# Patient Record
Sex: Female | Born: 1960 | Race: White | Hispanic: No | Marital: Married | Smoking: Current every day smoker
Health system: Southern US, Community
[De-identification: ages and names within clinical notes are randomized; demographics above are authoritative.]

## PROBLEM LIST (undated history)

## (undated) DIAGNOSIS — N183 Chronic kidney disease, stage 3 unspecified: Secondary | ICD-10-CM

## (undated) DIAGNOSIS — I739 Peripheral vascular disease, unspecified: Secondary | ICD-10-CM

## (undated) DIAGNOSIS — E119 Type 2 diabetes mellitus without complications: Secondary | ICD-10-CM

## (undated) DIAGNOSIS — I2510 Atherosclerotic heart disease of native coronary artery without angina pectoris: Secondary | ICD-10-CM

## (undated) DIAGNOSIS — D509 Iron deficiency anemia, unspecified: Secondary | ICD-10-CM

## (undated) DIAGNOSIS — I10 Essential (primary) hypertension: Secondary | ICD-10-CM

## (undated) DIAGNOSIS — I5032 Chronic diastolic (congestive) heart failure: Secondary | ICD-10-CM

## (undated) DIAGNOSIS — M869 Osteomyelitis, unspecified: Secondary | ICD-10-CM

## (undated) DIAGNOSIS — I779 Disorder of arteries and arterioles, unspecified: Secondary | ICD-10-CM

## (undated) DIAGNOSIS — G459 Transient cerebral ischemic attack, unspecified: Secondary | ICD-10-CM

## (undated) DIAGNOSIS — F419 Anxiety disorder, unspecified: Secondary | ICD-10-CM

## (undated) DIAGNOSIS — I82409 Acute embolism and thrombosis of unspecified deep veins of unspecified lower extremity: Secondary | ICD-10-CM

## (undated) HISTORY — PX: ABDOMINAL HYSTERECTOMY: SHX81

## (undated) HISTORY — PX: TRANSCAROTID ARTERY REVASCULARIZATION (TCAR): SHX6784

## (undated) HISTORY — PX: CHOLECYSTECTOMY: SHX55

## (undated) HISTORY — PX: TOE AMPUTATION: SHX809

## (undated) HISTORY — PX: BACK SURGERY: SHX140

---

## 2015-06-27 DIAGNOSIS — H5213 Myopia, bilateral: Secondary | ICD-10-CM | POA: Insufficient documentation

## 2015-06-27 DIAGNOSIS — H52223 Regular astigmatism, bilateral: Secondary | ICD-10-CM | POA: Insufficient documentation

## 2015-06-27 DIAGNOSIS — H029 Unspecified disorder of eyelid: Secondary | ICD-10-CM | POA: Insufficient documentation

## 2015-06-27 DIAGNOSIS — H2513 Age-related nuclear cataract, bilateral: Secondary | ICD-10-CM | POA: Insufficient documentation

## 2015-06-27 DIAGNOSIS — H35033 Hypertensive retinopathy, bilateral: Secondary | ICD-10-CM | POA: Insufficient documentation

## 2015-10-26 DIAGNOSIS — E1122 Type 2 diabetes mellitus with diabetic chronic kidney disease: Secondary | ICD-10-CM

## 2015-10-26 DIAGNOSIS — I10 Essential (primary) hypertension: Secondary | ICD-10-CM | POA: Diagnosis not present

## 2015-10-26 DIAGNOSIS — E1142 Type 2 diabetes mellitus with diabetic polyneuropathy: Secondary | ICD-10-CM

## 2015-10-26 DIAGNOSIS — E119 Type 2 diabetes mellitus without complications: Secondary | ICD-10-CM | POA: Diagnosis not present

## 2015-10-26 DIAGNOSIS — N938 Other specified abnormal uterine and vaginal bleeding: Secondary | ICD-10-CM

## 2015-10-26 DIAGNOSIS — I82409 Acute embolism and thrombosis of unspecified deep veins of unspecified lower extremity: Secondary | ICD-10-CM

## 2015-10-26 DIAGNOSIS — D62 Acute posthemorrhagic anemia: Secondary | ICD-10-CM

## 2015-10-26 DIAGNOSIS — E785 Hyperlipidemia, unspecified: Secondary | ICD-10-CM

## 2015-10-27 DIAGNOSIS — E1142 Type 2 diabetes mellitus with diabetic polyneuropathy: Secondary | ICD-10-CM | POA: Diagnosis not present

## 2015-10-27 DIAGNOSIS — I82409 Acute embolism and thrombosis of unspecified deep veins of unspecified lower extremity: Secondary | ICD-10-CM | POA: Diagnosis not present

## 2015-10-27 DIAGNOSIS — N938 Other specified abnormal uterine and vaginal bleeding: Secondary | ICD-10-CM

## 2015-10-27 DIAGNOSIS — E119 Type 2 diabetes mellitus without complications: Secondary | ICD-10-CM

## 2015-10-27 DIAGNOSIS — E1122 Type 2 diabetes mellitus with diabetic chronic kidney disease: Secondary | ICD-10-CM | POA: Diagnosis not present

## 2015-10-27 DIAGNOSIS — E785 Hyperlipidemia, unspecified: Secondary | ICD-10-CM | POA: Diagnosis not present

## 2015-10-27 DIAGNOSIS — I10 Essential (primary) hypertension: Secondary | ICD-10-CM | POA: Diagnosis not present

## 2015-10-27 DIAGNOSIS — D62 Acute posthemorrhagic anemia: Secondary | ICD-10-CM | POA: Diagnosis not present

## 2015-10-28 DIAGNOSIS — I10 Essential (primary) hypertension: Secondary | ICD-10-CM | POA: Diagnosis not present

## 2015-10-28 DIAGNOSIS — E119 Type 2 diabetes mellitus without complications: Secondary | ICD-10-CM | POA: Diagnosis not present

## 2015-10-28 DIAGNOSIS — N938 Other specified abnormal uterine and vaginal bleeding: Secondary | ICD-10-CM | POA: Diagnosis not present

## 2015-10-28 DIAGNOSIS — D62 Acute posthemorrhagic anemia: Secondary | ICD-10-CM | POA: Diagnosis not present

## 2015-10-29 DIAGNOSIS — E119 Type 2 diabetes mellitus without complications: Secondary | ICD-10-CM | POA: Diagnosis not present

## 2015-10-29 DIAGNOSIS — D62 Acute posthemorrhagic anemia: Secondary | ICD-10-CM | POA: Diagnosis not present

## 2015-10-29 DIAGNOSIS — N938 Other specified abnormal uterine and vaginal bleeding: Secondary | ICD-10-CM | POA: Diagnosis not present

## 2015-10-29 DIAGNOSIS — I10 Essential (primary) hypertension: Secondary | ICD-10-CM | POA: Diagnosis not present

## 2015-10-31 DIAGNOSIS — Z86718 Personal history of other venous thrombosis and embolism: Secondary | ICD-10-CM | POA: Insufficient documentation

## 2015-10-31 DIAGNOSIS — N950 Postmenopausal bleeding: Secondary | ICD-10-CM | POA: Insufficient documentation

## 2015-12-05 ENCOUNTER — Other Ambulatory Visit: Payer: Self-pay

## 2015-12-05 ENCOUNTER — Encounter: Payer: Self-pay | Admitting: Gynecology

## 2015-12-05 ENCOUNTER — Ambulatory Visit: Payer: Medicaid Other | Attending: Gynecology | Admitting: Gynecology

## 2015-12-05 VITALS — BP 155/74 | HR 112 | Temp 97.9°F | Resp 20 | Ht 68.0 in | Wt 252.9 lb

## 2015-12-05 DIAGNOSIS — N840 Polyp of corpus uteri: Secondary | ICD-10-CM | POA: Insufficient documentation

## 2015-12-05 DIAGNOSIS — Z79899 Other long term (current) drug therapy: Secondary | ICD-10-CM | POA: Insufficient documentation

## 2015-12-05 DIAGNOSIS — N939 Abnormal uterine and vaginal bleeding, unspecified: Secondary | ICD-10-CM | POA: Diagnosis not present

## 2015-12-05 DIAGNOSIS — E119 Type 2 diabetes mellitus without complications: Secondary | ICD-10-CM | POA: Insufficient documentation

## 2015-12-05 DIAGNOSIS — L0231 Cutaneous abscess of buttock: Secondary | ICD-10-CM

## 2015-12-05 DIAGNOSIS — L03317 Cellulitis of buttock: Secondary | ICD-10-CM | POA: Diagnosis not present

## 2015-12-05 DIAGNOSIS — F17210 Nicotine dependence, cigarettes, uncomplicated: Secondary | ICD-10-CM | POA: Insufficient documentation

## 2015-12-05 DIAGNOSIS — N8501 Benign endometrial hyperplasia: Secondary | ICD-10-CM

## 2015-12-05 MED ORDER — LEVOFLOXACIN 750 MG PO TABS: 750.0000 mg | ORAL_TABLET | Freq: Every day | ORAL | 0 refills | Status: AC

## 2015-12-05 NOTE — Patient Instructions (Addendum)
You were given a prescription for an antibiotic (Levaquin 750 mg) once daily for 10 days. Follow up with Dr. Senaida Oresichardson for the vulvar cellulitis and plan to have a Mirena IUD placed in his office.  Please call for any questions or concerns.

## 2015-12-05 NOTE — Progress Notes (Signed)
Consult Note: Gyn-Onc   Tresa EndoKelly Day [place] 55 y.o. female  No chief complaint on file.   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 Cellulitis of the left buttocks. Multiple comorbidities.  Plan:  Which regard to the endometrial polyps, I think the patient be best managed by placement of a Mirena IUD rather than risking the morbidity associated with a hysterectomy. Patient and her husband informed that complex hyperplasia without atypia is not a premalignant lesion and that the progesterone in the Mirena will most likely take care of the hyperplasia. I have contacted Dr. Alison Murrayris Richardson we will arrange to have the Mirena IUD placed in his office.  Cellulitis of the buttocks: The patient's instructed to use sitz baths 3 times a day and is given a prescription for Levaquin 750 mg daily for 10 days. Dr. Senaida Oresichardson we'll follow this as it may well require incision and drainage and we certainly want to avoid potential development of necrotizing fasciitis given the patient's diabetes and smoking history.     HPI: 55 year old white female seen in consultation request of Dr. Alison Murrayris Richardson regarding management of abnormal uterine bleeding. The patient underwent a D&C on 11/17/2015 revealing an endometrial polyp and focal complex hyperplasia without atypia. Subsequently the patient's had no further bleeding. Over the last 3 days the patient is also developed a very painful area of her left buttocks. She denies any fever or chills. Patient has a number of comorbid conditions noted in her past medical history.   Review of Systems:10 point review of systems is negative except as noted in interval history.   Vitals: Blood pressure (!) 155/74, pulse (!) 112, temperature 97.9 F (36.6 C), temperature source Oral, resp. rate 20, height 5\' 8"  (1.727 m), weight 252 lb 14.4 oz (114.7 kg), SpO2 100 %.  Physical Exam: General : The patient is a healthy woman in no acute  distress.  HEENT: normocephalic, extraoccular movements normal; neck is supple without thyromegally  Lynphnodes: Supraclavicular and inguinal nodes not enlarged  Abdomen: Soft, non-tender, no ascites, no organomegally, no masses, no hernias  Pelvic:  EGBUS:  on the right labial crural fold is a linear open area that is granulating and is not tender or painful. Patient has diffuse evidence of a yeast vulvitis. In addition on the left posterior buttocks is an area that is exquisitely tender and inflamed consistent with cellulitis. It is not fluctuant. Vagina is normal as is the cervix there is no bleeding noted. Bimanual exam is compromised by the patient's habitus and I cannot outline her uterus.  Bi-manual examination: Non-tender; no adenxal masses or nodularity  Rectal: normal sphincter tone, no masses, no blood  Lower extremities: No edema or varicosities. Normal range of motion      Not on File  History reviewed. No pertinent past medical history. diabetes, venous thrombosis, arthritis, obesity, asthma, diabetic retinopathy, hypertension,   History reviewed. No pertinent surgical history.  Current Outpatient Prescriptions  Medication Sig Dispense Refill  . B Complex Vitamins (VITAMIN B COMPLEX PO) Take by mouth.    . cephALEXin (KEFLEX) 500 MG capsule Take 500 mg by mouth 2 (two) times daily.  0  . cetirizine (ZYRTEC) 10 MG tablet TAKE ONE TABLET BY MOUTH EVERY DAY    . doxycycline (VIBRA-TABS) 100 MG tablet Take 100 mg by mouth 2 (two) times daily.  0  . EPINEPHrine 0.3 mg/0.3 mL IJ SOAJ injection See admin instructions.  0  . EPINEPHrine 0.3 mg/0.3 mL IJ SOAJ injection  AS DIRECTED    . FERROUS SULFATE PO Take 325 mg by mouth.    . gabapentin (NEURONTIN) 800 MG tablet TAKE 1 TABLET FOUR TIMES DAILY.    Marland Kitchen. glimepiride (AMARYL) 4 MG tablet Take 4 mg by mouth 2 (two) times daily.  0  . losartan (COZAAR) 25 MG tablet Take 50 mg by mouth daily.  2  . meloxicam (MOBIC) 15 MG tablet Take  15 mg by mouth.    . metroNIDAZOLE (METROGEL) 0.75 % vaginal gel INSERT ONE APPLICATORFUL VAGINALLY TWICE DAILY  0  . oxyCODONE-acetaminophen (PERCOCET/ROXICET) 5-325 MG tablet Take 1 tablet by mouth 2 (two) times daily.  0  . phentermine (ADIPEX-P) 37.5 MG tablet Take 18.75 mg by mouth daily.  0  . sitaGLIPtin (JANUVIA) 100 MG tablet TAKE 1 Tablet BY MOUTH ONCE DAILY    . SUMAtriptan (IMITREX) 100 MG tablet TAKE 1 TABLET EVERY DAY AS DIRECTED    . tiZANidine (ZANAFLEX) 4 MG tablet Take 4 mg by mouth 2 (two) times daily.  0  . traZODone (DESYREL) 50 MG tablet TAKE ONE TABLET BY MOUTH AT BEDTIME    . triamcinolone (KENALOG) 0.025 % cream APPLY TO THE AFFECTED AREA DAILY  0  . Vitamin D, Ergocalciferol, (DRISDOL) 50000 units CAPS capsule Take 50,000 Units by mouth once a week.  0   No current facility-administered medications for this visit.     Social History   Social History  . Marital status: Married    Spouse name: N/A  . Number of children: N/A  . Years of education: N/A   Occupational History  . Not on file.   Social History Main Topics  . Smoking status: Current Every Day Smoker    Packs/day: 1.50    Years: 15.00    Types: Cigarettes  . Smokeless tobacco: Never Used  . Alcohol use No  . Drug use: No  . Sexual activity: Not on file   Other Topics Concern  . Not on file   Social History Narrative  . No narrative on file    History reviewed. No pertinent family history.    De Blanchaniel Clarke-Pearson, MD 12/05/2015, 12:39 PM

## 2016-08-20 DIAGNOSIS — Z86718 Personal history of other venous thrombosis and embolism: Secondary | ICD-10-CM

## 2016-08-20 DIAGNOSIS — L03315 Cellulitis of perineum: Secondary | ICD-10-CM

## 2016-08-20 DIAGNOSIS — I10 Essential (primary) hypertension: Secondary | ICD-10-CM

## 2016-08-20 DIAGNOSIS — N764 Abscess of vulva: Secondary | ICD-10-CM

## 2016-08-20 DIAGNOSIS — E1122 Type 2 diabetes mellitus with diabetic chronic kidney disease: Secondary | ICD-10-CM

## 2016-08-20 DIAGNOSIS — E872 Acidosis: Secondary | ICD-10-CM

## 2016-08-20 DIAGNOSIS — E1149 Type 2 diabetes mellitus with other diabetic neurological complication: Secondary | ICD-10-CM

## 2016-08-20 DIAGNOSIS — E1142 Type 2 diabetes mellitus with diabetic polyneuropathy: Secondary | ICD-10-CM

## 2016-08-20 DIAGNOSIS — R791 Abnormal coagulation profile: Secondary | ICD-10-CM

## 2016-08-20 DIAGNOSIS — E785 Hyperlipidemia, unspecified: Secondary | ICD-10-CM

## 2016-08-21 ENCOUNTER — Inpatient Hospital Stay (HOSPITAL_COMMUNITY)
Admission: AD | Admit: 2016-08-21 | Discharge: 2016-09-02 | DRG: 746 | Disposition: A | Discharge status: Home / Self Care | Payer: Medicaid Other | Source: Other Acute Inpatient Hospital | Attending: Nephrology | Admitting: Nephrology

## 2016-08-21 ENCOUNTER — Encounter (HOSPITAL_COMMUNITY): Payer: Self-pay | Admitting: Internal Medicine

## 2016-08-21 DIAGNOSIS — Z6838 Body mass index (BMI) 38.0-38.9, adult: Secondary | ICD-10-CM

## 2016-08-21 DIAGNOSIS — Z86718 Personal history of other venous thrombosis and embolism: Secondary | ICD-10-CM

## 2016-08-21 DIAGNOSIS — R791 Abnormal coagulation profile: Secondary | ICD-10-CM

## 2016-08-21 DIAGNOSIS — R300 Dysuria: Secondary | ICD-10-CM | POA: Diagnosis present

## 2016-08-21 DIAGNOSIS — F329 Major depressive disorder, single episode, unspecified: Secondary | ICD-10-CM | POA: Diagnosis present

## 2016-08-21 DIAGNOSIS — I5032 Chronic diastolic (congestive) heart failure: Secondary | ICD-10-CM | POA: Diagnosis present

## 2016-08-21 DIAGNOSIS — Z7901 Long term (current) use of anticoagulants: Secondary | ICD-10-CM

## 2016-08-21 DIAGNOSIS — L02412 Cutaneous abscess of left axilla: Secondary | ICD-10-CM | POA: Diagnosis present

## 2016-08-21 DIAGNOSIS — E876 Hypokalemia: Secondary | ICD-10-CM | POA: Diagnosis not present

## 2016-08-21 DIAGNOSIS — D509 Iron deficiency anemia, unspecified: Secondary | ICD-10-CM | POA: Diagnosis present

## 2016-08-21 DIAGNOSIS — N183 Chronic kidney disease, stage 3 unspecified: Secondary | ICD-10-CM | POA: Diagnosis present

## 2016-08-21 DIAGNOSIS — L03112 Cellulitis of left axilla: Secondary | ICD-10-CM

## 2016-08-21 DIAGNOSIS — F17210 Nicotine dependence, cigarettes, uncomplicated: Secondary | ICD-10-CM | POA: Diagnosis present

## 2016-08-21 DIAGNOSIS — R1013 Epigastric pain: Secondary | ICD-10-CM | POA: Diagnosis not present

## 2016-08-21 DIAGNOSIS — N764 Abscess of vulva: Principal | ICD-10-CM | POA: Diagnosis present

## 2016-08-21 DIAGNOSIS — N8502 Endometrial intraepithelial neoplasia [EIN]: Secondary | ICD-10-CM | POA: Diagnosis present

## 2016-08-21 DIAGNOSIS — Z79899 Other long term (current) drug therapy: Secondary | ICD-10-CM

## 2016-08-21 DIAGNOSIS — I10 Essential (primary) hypertension: Secondary | ICD-10-CM | POA: Diagnosis present

## 2016-08-21 DIAGNOSIS — R072 Precordial pain: Secondary | ICD-10-CM | POA: Diagnosis not present

## 2016-08-21 DIAGNOSIS — E1165 Type 2 diabetes mellitus with hyperglycemia: Secondary | ICD-10-CM | POA: Diagnosis present

## 2016-08-21 DIAGNOSIS — E1122 Type 2 diabetes mellitus with diabetic chronic kidney disease: Secondary | ICD-10-CM | POA: Diagnosis not present

## 2016-08-21 DIAGNOSIS — I130 Hypertensive heart and chronic kidney disease with heart failure and stage 1 through stage 4 chronic kidney disease, or unspecified chronic kidney disease: Secondary | ICD-10-CM | POA: Diagnosis present

## 2016-08-21 DIAGNOSIS — E119 Type 2 diabetes mellitus without complications: Secondary | ICD-10-CM | POA: Diagnosis present

## 2016-08-21 DIAGNOSIS — Z791 Long term (current) use of non-steroidal anti-inflammatories (NSAID): Secondary | ICD-10-CM

## 2016-08-21 DIAGNOSIS — L03315 Cellulitis of perineum: Secondary | ICD-10-CM | POA: Diagnosis present

## 2016-08-21 DIAGNOSIS — I82409 Acute embolism and thrombosis of unspecified deep veins of unspecified lower extremity: Secondary | ICD-10-CM | POA: Diagnosis present

## 2016-08-21 DIAGNOSIS — Z841 Family history of disorders of kidney and ureter: Secondary | ICD-10-CM

## 2016-08-21 DIAGNOSIS — Z7984 Long term (current) use of oral hypoglycemic drugs: Secondary | ICD-10-CM

## 2016-08-21 DIAGNOSIS — F419 Anxiety disorder, unspecified: Secondary | ICD-10-CM | POA: Diagnosis present

## 2016-08-21 DIAGNOSIS — Z833 Family history of diabetes mellitus: Secondary | ICD-10-CM | POA: Diagnosis not present

## 2016-08-21 DIAGNOSIS — I509 Heart failure, unspecified: Secondary | ICD-10-CM

## 2016-08-21 DIAGNOSIS — R102 Pelvic and perineal pain: Secondary | ICD-10-CM | POA: Diagnosis present

## 2016-08-21 DIAGNOSIS — E1169 Type 2 diabetes mellitus with other specified complication: Secondary | ICD-10-CM

## 2016-08-21 HISTORY — DX: Chronic kidney disease, stage 3 unspecified: N18.30

## 2016-08-21 HISTORY — DX: Essential (primary) hypertension: I10

## 2016-08-21 HISTORY — DX: Chronic kidney disease, stage 3 (moderate): N18.3

## 2016-08-21 HISTORY — DX: Abnormal coagulation profile: R79.1

## 2016-08-21 HISTORY — DX: Cutaneous abscess of left axilla: L02.412

## 2016-08-21 HISTORY — DX: Cellulitis of perineum: L03.315

## 2016-08-21 HISTORY — DX: Acute embolism and thrombosis of unspecified deep veins of unspecified lower extremity: I82.409

## 2016-08-21 HISTORY — DX: Abscess of vulva: N76.4

## 2016-08-21 HISTORY — DX: Chronic diastolic (congestive) heart failure: I50.32

## 2016-08-21 HISTORY — DX: Iron deficiency anemia, unspecified: D50.9

## 2016-08-21 HISTORY — DX: Type 2 diabetes mellitus without complications: E11.9

## 2016-08-21 HISTORY — DX: Cellulitis of left axilla: L03.112

## 2016-08-21 MED ORDER — ZOLPIDEM TARTRATE 5 MG PO TABS: 5.0000 mg | ORAL_TABLET | Freq: Every evening | ORAL | Status: DC | PRN

## 2016-08-21 MED ORDER — TRAZODONE HCL 50 MG PO TABS
150.0000 mg | ORAL_TABLET | Freq: Every day | ORAL | Status: DC
  Administered 2016-08-22 – 2016-09-01 (×12): 150 mg via ORAL
  Filled 2016-08-21 (×11): qty 1

## 2016-08-21 MED ORDER — ACETAMINOPHEN 325 MG PO TABS: 650.0000 mg | ORAL_TABLET | Freq: Four times a day (QID) | ORAL | Status: DC | PRN

## 2016-08-21 MED ORDER — OXYCODONE-ACETAMINOPHEN 5-325 MG PO TABS
1.0000 | ORAL_TABLET | ORAL | Status: DC | PRN
  Administered 2016-08-22 – 2016-09-02 (×34): 1 via ORAL
  Filled 2016-08-21 (×35): qty 1

## 2016-08-21 MED ORDER — SUMATRIPTAN SUCCINATE 50 MG PO TABS
50.0000 mg | ORAL_TABLET | ORAL | Status: DC | PRN
  Filled 2016-08-21: qty 1

## 2016-08-21 MED ORDER — PREGABALIN 100 MG PO CAPS
100.0000 mg | ORAL_CAPSULE | Freq: Two times a day (BID) | ORAL | Status: DC
  Administered 2016-08-22 – 2016-09-02 (×24): 100 mg via ORAL
  Filled 2016-08-21 (×23): qty 1

## 2016-08-21 MED ORDER — ONDANSETRON HCL 4 MG/2ML IJ SOLN: 4.0000 mg | Freq: Three times a day (TID) | INTRAMUSCULAR | Status: DC | PRN

## 2016-08-21 MED ORDER — NICOTINE 21 MG/24HR TD PT24
21.0000 mg | MEDICATED_PATCH | Freq: Every day | TRANSDERMAL | Status: DC
  Administered 2016-08-24 – 2016-08-26 (×3): 21 mg via TRANSDERMAL
  Filled 2016-08-21 (×8): qty 1

## 2016-08-21 MED ORDER — ACETAMINOPHEN 650 MG RE SUPP: 650.0000 mg | Freq: Four times a day (QID) | RECTAL | Status: DC | PRN

## 2016-08-21 MED ORDER — FERROUS SULFATE 325 (65 FE) MG PO TABS
325.0000 mg | ORAL_TABLET | Freq: Every day | ORAL | Status: DC
  Administered 2016-08-22 – 2016-09-02 (×12): 325 mg via ORAL
  Filled 2016-08-21 (×12): qty 1

## 2016-08-21 MED ORDER — HYDRALAZINE HCL 20 MG/ML IJ SOLN: 5.0000 mg | INTRAMUSCULAR | Status: DC | PRN

## 2016-08-21 MED ORDER — FLUTICASONE PROPIONATE 50 MCG/ACT NA SUSP: 2.0000 | Freq: Every day | NASAL | Status: DC | PRN

## 2016-08-21 MED ORDER — EPINEPHRINE 0.3 MG/0.3ML IJ SOAJ: 0.3000 mg | INTRAMUSCULAR | Status: DC | PRN

## 2016-08-21 MED ORDER — CLOTRIMAZOLE 1 % EX CREA
TOPICAL_CREAM | Freq: Two times a day (BID) | CUTANEOUS | Status: DC
  Administered 2016-08-23 – 2016-09-02 (×15): via TOPICAL
  Filled 2016-08-21: qty 15

## 2016-08-21 MED ORDER — LOSARTAN POTASSIUM 50 MG PO TABS
25.0000 mg | ORAL_TABLET | Freq: Every day | ORAL | Status: DC
  Administered 2016-08-22 – 2016-09-02 (×12): 25 mg via ORAL
  Filled 2016-08-21 (×12): qty 1

## 2016-08-21 MED ORDER — TIZANIDINE HCL 4 MG PO TABS
4.0000 mg | ORAL_TABLET | Freq: Three times a day (TID) | ORAL | Status: DC | PRN
  Administered 2016-08-31 – 2016-09-01 (×2): 4 mg via ORAL
  Filled 2016-08-21 (×2): qty 1

## 2016-08-21 MED ORDER — SENNOSIDES-DOCUSATE SODIUM 8.6-50 MG PO TABS
1.0000 | ORAL_TABLET | Freq: Every evening | ORAL | Status: DC | PRN
  Administered 2016-08-23: 1 via ORAL

## 2016-08-21 NOTE — H&P (Signed)
History and Physical    Hannah [name]:[id number] DOB: 1960-08-05 DOA: 08/21/2016  Referring MD/NP/PA:   PCP: Anselmo Pickler, MD   Patient coming from:  The patient is coming from home.  At baseline, pt is independent for most of ADL.      Chief Complaint: Abscess and cellulitis in perineal and left axillar areas, and supratherapeutic INR.  HPI: Hannah [name] is a 56 y.o. female with medical history significant of hypertension, diabetes mellitus, depression, anxiety, DVT on Coumadin, dCHF, iron deficiency anemia, tobacco abuse, CKD-2, who presents with abscess and cellulitis in perineal and left axillar areas, and supratherapeutic INR.  Pt states that she has been having left axillary abscess in the past two weeks, which has popped up. 6 days ago, she noted labial abscess and perineal pain. The pain is constant, 6 out of 10 in severity, sharp, nonradiating. It is associated with intermittent fever up to 102 and chills.   Pt was initially admitted to [facility] on 08/20/16. She was found to have labial abscess with secondary perineal cellulitis. Her INR was 33 without bleeding. She was given FFP and her INR is down to 3.5 this AM. She was started with IV Zosyn and vancomycin. Currently patient does not have fever or chills. She denies chest pain, SOB, cough, nausea, vomiting, diarrhea, abdominal pain. She reports dysuria and burning on urination recently. Her urinalysis was negative in [facility]. Of note, After patient received 3rd units of FFP and one dose of Lasix, patient developed diffuse itchy and urticarial rash in her trunk, bilateral thighs. No signs of stridor, hoarseness, wheezing tongue swelling. Patient was treated with diphenhydramine, famotidine, Solu-Medrol. Her symptoms have subsided. Per discharge summary, if patient needs diuretics in the future, may use Bumex instead of Lasix.   At admission:  pt was found to have WBC 19.3, lactic acid 1.8, BNP 419,  creatinine 1.07, temperature normal, no tachycardia, oxygen saturation 93% on room air. Patient is admitted to MedSurg bed as inpatient. OB/GYN, Dr. Adrian Blackwater was consulted.  Review of Systems:   General: had fevers, chills, has fatigue HEENT: no blurry vision, hearing changes or sore throat Respiratory: no dyspnea, coughing, wheezing CV: no chest pain, no palpitations GI: no nausea, vomiting, abdominal pain, diarrhea, constipation GU: has dysuria, burning on urination, increased urinary frequency, no hematuria  Ext: no leg edema Neuro: no unilateral weakness, numbness, or tingling, no vision change or hearing loss Skin: had rash, no skin tear. MSK: No muscle spasm, no deformity, no limitation of range of movement in spin Heme: No easy bruising.  Travel history: No recent long distant travel.  Allergy:  Allergies  Allergen Reactions  . Naproxen Other (See Comments)    Emotional and cramps    Past Medical History:  Diagnosis Date  . Chronic diastolic (congestive) heart failure (HCC)   . CKD (chronic kidney disease), stage III   . Diabetes mellitus without complication (HCC)   . DVT (deep venous thrombosis) (HCC)   . Essential hypertension   . Iron deficiency anemia     Past Surgical History:  Procedure Laterality Date  . BACK SURGERY      Social History:  reports that she has been smoking Cigarettes.  She has a 22.50 pack-year smoking history. She has never used smokeless tobacco. She reports that she does not drink alcohol or use drugs.  Family History:  Family History  Problem Relation Age of Onset  . Hypertension Mother   . Diabetes Mellitus II  Father   . Kidney disease Father      Prior to Admission medications   Medication Sig Start Date End Date Taking? Authorizing Provider  B Complex Vitamins (VITAMIN B COMPLEX PO) Take by mouth.    [provider]  cephALEXin (KEFLEX) 500 MG capsule Take 500 mg by mouth 2 (two) times daily. 09/15/15   [provider]  cetirizine (ZYRTEC) 10 MG tablet TAKE ONE TABLET BY MOUTH EVERY DAY 10/18/15   [provider]  doxycycline (VIBRA-TABS) 100 MG tablet Take 100 mg by mouth 2 (two) times daily. 09/23/15   [provider]  EPINEPHrine 0.3 mg/0.3 mL IJ SOAJ injection See admin instructions. 09/12/15   [provider]  EPINEPHrine 0.3 mg/0.3 mL IJ SOAJ injection AS DIRECTED 09/12/15   [provider]  FERROUS SULFATE PO Take 325 mg by mouth.    [provider]  gabapentin (NEURONTIN) 800 MG tablet TAKE 1 TABLET FOUR TIMES DAILY. 04/27/15   [provider]  glimepiride (AMARYL) 4 MG tablet Take 4 mg by mouth 2 (two) times daily. 10/18/15   [provider]  losartan (COZAAR) 25 MG tablet Take 50 mg by mouth daily. 10/03/15   [provider]  meloxicam (MOBIC) 15 MG tablet Take 15 mg by mouth.    [provider]  metroNIDAZOLE (METROGEL) 0.75 % vaginal gel INSERT ONE APPLICATORFUL VAGINALLY TWICE DAILY 09/23/15   [provider]  oxyCODONE-acetaminophen (PERCOCET/ROXICET) 5-325 MG tablet Take 1 tablet by mouth 2 (two) times daily. 09/15/15   [provider]  phentermine (ADIPEX-P) 37.5 MG tablet Take 18.75 mg by mouth daily. 09/08/15   [provider]  sitaGLIPtin (JANUVIA) 100 MG tablet TAKE 1 Tablet BY MOUTH ONCE DAILY 09/20/15   [provider]  SUMAtriptan (IMITREX) 100 MG tablet TAKE 1 TABLET EVERY DAY AS DIRECTED 09/15/15   [provider]  tiZANidine (ZANAFLEX) 4 MG tablet Take 4 mg by mouth 2 (two) times daily. 10/12/15   [provider]  traZODone (DESYREL) 50 MG tablet TAKE ONE TABLET BY MOUTH AT BEDTIME 10/18/15   [provider]  triamcinolone (KENALOG) 0.025 % cream APPLY TO THE AFFECTED AREA DAILY 11/25/15   [provider]  Vitamin D, Ergocalciferol, (DRISDOL) 50000 units CAPS capsule Take 50,000 Units by mouth once a week. 11/25/15   [provider]    Physical Exam: Vitals:   08/21/16 2345  BP: (!) 127/58  Pulse: 84  Resp: 18  Temp: 98.3 F (36.8 C)  TempSrc: Oral  SpO2: 93%  Weight: 111.4 kg (245 lb 9.6 oz)  Height: 5\' 8"  (1.727 m)   General: Not in acute distress HEENT:       Eyes: PERRL, EOMI, no scleral icterus.       ENT: No discharge from the ears and nose, no pharynx injection, no tonsillar enlargement.        Neck: No JVD, no bruit, no mass felt. Heme: No neck lymph node enlargement. Cardiac: S1/S2, RRR, No murmurs, No gallops or rubs. Respiratory: No rales, wheezing, rhonchi or rubs. GI: Soft, nondistended, nontender, no rebound pain, no organomegaly, BS present. GU: No hematuria Ext: No pitting leg edema bilaterally. 2+DP/PT pulse bilaterally. Musculoskeletal: No joint deformities, No joint redness or warmth, no limitation of ROM in spin. Skin: has popped abscess in the left axillary area with a surrounding erythema. Pt has abscess with surrounding erythema in labial and perineal area. Neuro: Alert, oriented X3, cranial nerves II-XII grossly intact,  moves all extremities normally. Psych: Patient is not psychotic, no suicidal or hemocidal ideation.  Labs on Admission: I have personally reviewed following labs and imaging studies  CBC:  Recent Labs Lab 08/21/16 2358  WBC 19.3*  NEUTROABS 16.9*  HGB 12.8  HCT 39.3  MCV 89.1  PLT 250   Basic Metabolic Panel:  Recent Labs Lab 08/21/16 2358  NA 137  K 3.8  CL 105  CO2 20*  GLUCOSE 244*  BUN 24*  CREATININE 1.07*  CALCIUM 8.8*   GFR: Estimated Creatinine Clearance: 75 mL/min (A) (by C-G formula based on SCr of 1.07 mg/dL (H)). Liver Function Tests: No results for input(s): AST, ALT, ALKPHOS, BILITOT, PROT, ALBUMIN in the last 168 hours. No results for input(s): LIPASE, AMYLASE in the last 168 hours. No results for input(s): AMMONIA in the last 168 hours. Coagulation Profile:  Recent Labs Lab 08/21/16 2358  INR 2.74   Cardiac  Enzymes: No results for input(s): CKTOTAL, CKMB, CKMBINDEX, TROPONINI in the last 168 hours. BNP (last 3 results) No results for input(s): PROBNP in the last 8760 hours. HbA1C: No results for input(s): HGBA1C in the last 72 hours. CBG: No results for input(s): GLUCAP in the last 168 hours. Lipid Profile: No results for input(s): CHOL, HDL, LDLCALC, TRIG, CHOLHDL, LDLDIRECT in the last 72 hours. Thyroid Function Tests: No results for input(s): TSH, T4TOTAL, FREET4, T3FREE, THYROIDAB in the last 72 hours. Anemia Panel: No results for input(s): VITAMINB12, FOLATE, FERRITIN, TIBC, IRON, RETICCTPCT in the last 72 hours. Urine analysis: No results found for: COLORURINE, APPEARANCEUR, LABSPEC, PHURINE, GLUCOSEU, HGBUR, BILIRUBINUR, KETONESUR, PROTEINUR, UROBILINOGEN, NITRITE, LEUKOCYTESUR Sepsis Labs: @LABRCNTIP (procalcitonin:4,lacticidven:4) )No results found for this or any previous visit (from the past 240 hour(s)).   Radiological Exams on Admission: No results found.   EKG: Reviewed independently. Sinus rhythm, QTC 440, low voltage, anteroseptal infarction pattern.   Assessment/Plan Principal Problem:   Labial abscess Active Problems:   Iron deficiency anemia   Essential hypertension   DVT (deep venous thrombosis) (HCC)   Diabetes mellitus with renal complications (HCC)   Cellulitis, perineum   Abscess of left axilla   Cellulitis of axilla, left   Chronic diastolic CHF (congestive heart failure) (HCC)   Supratherapeutic INR   CKD (chronic kidney disease), stage III   Labial abscess and perineal cellulitis, and abscess/cellulitis of left axillary area: Currently patient is nonseptic. Lactic acid is normal. Hemodynamically stable.  OB/GYN, Dr. Candelaria Celeste was consulted, plan to do I &D when INR is less than 2. They are unsure if they will do surgery at [facility] versus [facility], depending on where OR availability is.  - will admit to med-surg bed as inpt - continue  vancomycin and Zosyn per pharmacy - PRN Zofran for nausea, morphine and Percocet for pain - Blood cultures x 2  - wound care consult - f/u ob/gyn for recommendations -check INR and PTT  Iron deficiency anemia:  -Continue iron supplement  Hypertension: -IV hydralazine when necessary -Continue Cozaar,  Tobacco abuse: -Did counseling about importance of quitting smoking -Nicotine patch  Hx of DVT: on coumadin with supratherapeutic.  -check INR -hold coumadin now  Diabetes mellitus with renal complications: Last A1c is nor on record. Patient is taking Amaryl at home -SSI -Check A1c  Chronic diastolic CHF (congestive heart failure) [facility]): Patient does not have leg edema JVD. CHF is compensated. 2-D echo on 08/21/16 showed EF 60-65%. In [facility] after patient received 3rd units of FFP and one dose  of Lasix, patient developed diffuse itchy and urticarial rash in her trunk, bilateral thighs. Not sure if pt is allergic to FFP or lasix. It may better to avoid using lasix in future. -check BNP  CKD (chronic kidney disease), stage III: Stable. Creatinine is 1.07 on admission. To follow up renal function by BMP   DVT ppx: None (pt has supratherapeutic INR) Code Status: Full code Family Communication: None at bed side.    Disposition Plan:  Anticipate discharge back to previous home environment Consults called:  Ob/gyn, Dr. Adrian Blackwater Admission status: medical floor/obs      Date of Service 08/22/2016    Lorretta Harp [facility] Pager [phone]  If 7PM-7AM, please contact night-coverage [email or web address] Password [email or web address] 08/22/2016, 1:36 AM

## 2016-08-22 LAB — CBC
HCT: 38.6 % (ref 36.0–46.0)
HEMOGLOBIN: 12.7 g/dL (ref 12.0–15.0)
MCH: 29.5 pg (ref 26.0–34.0)
MCHC: 32.9 g/dL (ref 30.0–36.0)
MCV: 89.6 fL (ref 78.0–100.0)
Platelets: 256 10*3/uL (ref 150–400)
RBC: 4.31 MIL/uL (ref 3.87–5.11)
RDW: 14.5 % (ref 11.5–15.5)
WBC: 17.9 10*3/uL — AB (ref 4.0–10.5)

## 2016-08-22 LAB — BASIC METABOLIC PANEL
ANION GAP: 12 (ref 5–15)
ANION GAP: 9 (ref 5–15)
BUN: 24 mg/dL — ABNORMAL HIGH (ref 6–20)
BUN: 25 mg/dL — ABNORMAL HIGH (ref 6–20)
CALCIUM: 8.8 mg/dL — AB (ref 8.9–10.3)
CALCIUM: 8.8 mg/dL — AB (ref 8.9–10.3)
CHLORIDE: 107 mmol/L (ref 101–111)
CO2: 20 mmol/L — ABNORMAL LOW (ref 22–32)
CO2: 21 mmol/L — AB (ref 22–32)
CREATININE: 1.07 mg/dL — AB (ref 0.44–1.00)
Chloride: 105 mmol/L (ref 101–111)
Creatinine, Ser: 0.99 mg/dL (ref 0.44–1.00)
GFR, EST NON AFRICAN AMERICAN: 56 mL/min — AB (ref >60)
GLUCOSE: 201 mg/dL — AB (ref 65–99)
GLUCOSE: 244 mg/dL — AB (ref 65–99)
POTASSIUM: 3.8 mmol/L (ref 3.5–5.1)
Potassium: 3.8 mmol/L (ref 3.5–5.1)
Sodium: 137 mmol/L (ref 135–145)
Sodium: 137 mmol/L (ref 135–145)

## 2016-08-22 LAB — CBC WITH DIFFERENTIAL/PLATELET
BASOS ABS: 0 10*3/uL (ref 0.0–0.1)
BASOS PCT: 0 %
EOS PCT: 0 %
Eosinophils Absolute: 0 10*3/uL (ref 0.0–0.7)
HCT: 39.3 % (ref 36.0–46.0)
Hemoglobin: 12.8 g/dL (ref 12.0–15.0)
Lymphocytes Relative: 6 %
Lymphs Abs: 1.2 10*3/uL (ref 0.7–4.0)
MCH: 29 pg (ref 26.0–34.0)
MCHC: 32.6 g/dL (ref 30.0–36.0)
MCV: 89.1 fL (ref 78.0–100.0)
MONO ABS: 1.2 10*3/uL — AB (ref 0.1–1.0)
Monocytes Relative: 6 %
Neutro Abs: 16.9 10*3/uL — ABNORMAL HIGH (ref 1.7–7.7)
Neutrophils Relative %: 88 %
PLATELETS: 250 10*3/uL (ref 150–400)
RBC: 4.41 MIL/uL (ref 3.87–5.11)
RDW: 14.5 % (ref 11.5–15.5)
WBC: 19.3 10*3/uL — ABNORMAL HIGH (ref 4.0–10.5)

## 2016-08-22 LAB — HIV ANTIBODY (ROUTINE TESTING W REFLEX): HIV SCREEN 4TH GENERATION: NONREACTIVE

## 2016-08-22 LAB — HEMOGLOBIN A1C
HEMOGLOBIN A1C: 11.9 % — AB (ref 4.8–5.6)
MEAN PLASMA GLUCOSE: 294.83 mg/dL

## 2016-08-22 LAB — APTT: APTT: 60 s — AB (ref 24–36)

## 2016-08-22 LAB — BRAIN NATRIURETIC PEPTIDE: B Natriuretic Peptide: 419 pg/mL — ABNORMAL HIGH (ref 0.0–100.0)

## 2016-08-22 LAB — GLUCOSE, CAPILLARY
GLUCOSE-CAPILLARY: 218 mg/dL — AB (ref 65–99)
GLUCOSE-CAPILLARY: 263 mg/dL — AB (ref 65–99)
Glucose-Capillary: 183 mg/dL — ABNORMAL HIGH (ref 65–99)
Glucose-Capillary: 185 mg/dL — ABNORMAL HIGH (ref 65–99)
Glucose-Capillary: 148 mg/dL — ABNORMAL HIGH (ref 65–99)
Glucose-Capillary: 264 mg/dL — ABNORMAL HIGH (ref 65–99)
Glucose-Capillary: 214 mg/dL — ABNORMAL HIGH (ref 65–99)

## 2016-08-22 LAB — LACTIC ACID, PLASMA: LACTIC ACID, VENOUS: 1.8 mmol/L (ref 0.5–1.9)

## 2016-08-22 LAB — PROTIME-INR
INR: 2.38
INR: 2.74
PROTHROMBIN TIME: 26.4 s — AB (ref 11.4–15.2)
PROTHROMBIN TIME: 29.5 s — AB (ref 11.4–15.2)

## 2016-08-22 LAB — TROPONIN I: Troponin I: 0.09 ng/mL (ref <0.03)

## 2016-08-22 MED ORDER — INSULIN ASPART 100 UNIT/ML ~~LOC~~ SOLN
0.0000 [IU] | Freq: Three times a day (TID) | SUBCUTANEOUS | Status: DC
  Administered 2016-08-22: 2 [IU] via SUBCUTANEOUS
  Administered 2016-08-22: 5 [IU] via SUBCUTANEOUS
  Administered 2016-08-23: 3 [IU] via SUBCUTANEOUS
  Administered 2016-08-23: 5 [IU] via SUBCUTANEOUS
  Administered 2016-08-23: 2 [IU] via SUBCUTANEOUS
  Administered 2016-08-24: 3 [IU] via SUBCUTANEOUS
  Administered 2016-08-24: 5 [IU] via SUBCUTANEOUS
  Administered 2016-08-24: 3 [IU] via SUBCUTANEOUS
  Administered 2016-08-25 (×2): 5 [IU] via SUBCUTANEOUS
  Administered 2016-08-25 – 2016-08-26 (×2): 3 [IU] via SUBCUTANEOUS
  Administered 2016-08-26: 2 [IU] via SUBCUTANEOUS
  Administered 2016-08-26 – 2016-08-27 (×3): 3 [IU] via SUBCUTANEOUS
  Administered 2016-08-27: 5 [IU] via SUBCUTANEOUS
  Administered 2016-08-28: 2 [IU] via SUBCUTANEOUS
  Administered 2016-08-28 (×2): 3 [IU] via SUBCUTANEOUS
  Administered 2016-08-29 (×2): 2 [IU] via SUBCUTANEOUS
  Administered 2016-08-30: 5 [IU] via SUBCUTANEOUS
  Administered 2016-08-30: 2 [IU] via SUBCUTANEOUS
  Administered 2016-08-31: 6 [IU] via SUBCUTANEOUS
  Administered 2016-08-31: 5 [IU] via SUBCUTANEOUS
  Administered 2016-08-31 – 2016-09-01 (×3): 2 [IU] via SUBCUTANEOUS
  Administered 2016-09-02: 1 [IU] via SUBCUTANEOUS

## 2016-08-22 MED ORDER — INSULIN ASPART 100 UNIT/ML ~~LOC~~ SOLN
0.0000 [IU] | Freq: Every day | SUBCUTANEOUS | Status: DC
  Administered 2016-08-22 – 2016-08-24 (×3): 2 [IU] via SUBCUTANEOUS
  Administered 2016-08-25: 3 [IU] via SUBCUTANEOUS
  Administered 2016-08-26 – 2016-08-31 (×2): 2 [IU] via SUBCUTANEOUS

## 2016-08-22 MED ORDER — LIVING WELL WITH DIABETES BOOK
Freq: Once | Status: AC
  Administered 2016-08-22: 13:00:00
  Filled 2016-08-22: qty 1

## 2016-08-22 MED ORDER — FAMOTIDINE 20 MG PO TABS
20.0000 mg | ORAL_TABLET | Freq: Two times a day (BID) | ORAL | Status: DC
  Administered 2016-08-22 – 2016-09-02 (×22): 20 mg via ORAL
  Filled 2016-08-22 (×22): qty 1

## 2016-08-22 MED ORDER — PREGABALIN 100 MG PO CAPS
ORAL_CAPSULE | ORAL | Status: AC
  Filled 2016-08-22: qty 1

## 2016-08-22 MED ORDER — PIPERACILLIN-TAZOBACTAM 3.375 G IVPB
3.3750 g | Freq: Three times a day (TID) | INTRAVENOUS | Status: DC
  Administered 2016-08-22 – 2016-08-31 (×26): 3.375 g via INTRAVENOUS
  Filled 2016-08-22 (×29): qty 50

## 2016-08-22 MED ORDER — EPINEPHRINE PF 1 MG/ML IJ SOLN
0.3000 mg | INTRAMUSCULAR | Status: DC | PRN
  Filled 2016-08-22: qty 1

## 2016-08-22 MED ORDER — TRAZODONE HCL 50 MG PO TABS
ORAL_TABLET | ORAL | Status: AC
  Filled 2016-08-22: qty 3

## 2016-08-22 MED ORDER — VANCOMYCIN HCL IN DEXTROSE 750-5 MG/150ML-% IV SOLN
750.0000 mg | Freq: Two times a day (BID) | INTRAVENOUS | Status: DC
  Administered 2016-08-22 – 2016-08-24 (×4): 750 mg via INTRAVENOUS
  Filled 2016-08-22 (×6): qty 150

## 2016-08-22 NOTE — Progress Notes (Signed)
Pharmacy Antibiotic and Anticoagulation Note  Hannah [name] is a 56 y.o. female transferred [facility] on 08/21/2016 with axilla and labial abscesses.  Pharmacy has been consulted for Vancomycin and Zosyn dosing.  Pt also has a hx of DVT and was on Coumadin PTA.  Plan to bridge with heparin while Coumadin on hold for possible surgery.  INR today 2.38.  Events PTA:  8/3 started on Bactrim PO by PCP (interaction with Coumadin) 8/6 [facility] with worsening abscess, INR 13 8/6 started Vanc 2gm IV q18h and Zosyn 3.375gm IV q6h (30 min infusion) 8/7 @ 1330 rec'd 10mg  SQ Vit K 8/7 PM Tx [place]   Plan: Follow-up INR in AM.  Start heparin when INR <2. Vancomycin 750mg  IV q12h ([facility]) Zosyn 3.375 gm IV q8h (4 hour infusion).  Height: 5\' 8"  (172.7 cm) Weight: 245 lb 9.6 oz (111.4 kg) IBW/kg (Calculated) : 63.9  Temp (24hrs), Avg:98 F (36.7 C), Min:97.7 F (36.5 C), Max:98.3 F (36.8 C)   Recent Labs Lab 08/21/16 2358 08/22/16 0409  WBC 19.3* 17.9*  CREATININE 1.07* 0.99  LATICACIDVEN 1.8  --     Estimated Creatinine Clearance: 81.1 mL/min (by C-G formula based on SCr of 0.99 mg/dL).    Lab Results  Component Value Date   INR 2.38 08/22/2016   INR 2.74 08/21/2016     Allergies  Allergen Reactions  . Naproxen Other (See Comments)    Emotional and cramps    Antimicrobials this admission: Bactrim outpt PTA 8/3 >>8/6 Vanc 8/6 >> * [facility]gm [facility]h [facility] - last dose 8/7 @ 1130 Zosyn 8/6 >> * 3.375gm [facility]h [facility] - last dose 8/7 @ 2000  Dose adjustments this admission:   Microbiology results: 8/8 blood x 2 >> 8/7 urine >>  Thank you for allowing pharmacy to be a part of this patient's care.  Toys 'R' UsKimberly Shalay Carder, Pharm.D., BCPS Clinical Pharmacist Pager: [phone] Clinical phone for 08/22/2016 from 8:30-4:00 is [phone]. After 4pm, please call Main Rx [phone]) for assistance. 08/22/2016  12:38 PM

## 2016-08-22 NOTE — Progress Notes (Signed)
4:56 pm patient reported chest pressure 6/10, bp 104/50 HR 84, RR 14, 99% SPo2. Provider notified, EKG done. Will continue to monitor

## 2016-08-22 NOTE — Progress Notes (Signed)
[facility]. Pt is A&Ox4. Pt lives at home with husband. Pt has large bruised area around IV on left arm, under left arm pit is red area with open abscess, red swollen abscess area to entire groin area, and under bilateral breast is red and dry. Pt oriented to room. Told to call for assistance before getting up. Pt stated understanding. Will continue to monitor pt. Nelda MarseilleJenny Thacker, RN

## 2016-08-22 NOTE — Progress Notes (Signed)
Inpatient Diabetes Program Recommendations  AACE/ADA: New Consensus Statement on Inpatient Glycemic Control (2015)  Target Ranges:  Prepandial:   less than 140 mg/dL      Peak postprandial:   less than 180 mg/dL (1-2 hours)      Critically ill patients:  140 - 180 mg/dL   Lab Results  Component Value Date   GLUCAP 185 (H) 08/22/2016   HGBA1C 11.9 (H) 08/22/2016    Review of Glycemic Control  Diabetes history: DM2 Outpatient Diabetes medications: Amaryl 4 mg bid + Januvia 100 qd Current orders for Inpatient glycemic control: Novolog sensitive correction scale  Inpatient Diabetes Program Recommendations:   Spoke with pt @ bedside about A1C 11.9 (average CBG over the past 2-3 months) 295 results with them and explained what an A1C is, basic pathophysiology of DM Type 2, basic home care, basic diabetes diet nutrition principles, importance of checking CBGs and maintaining good CBG control to prevent long-term and short-term complications. Reviewed signs and symptoms of hyperglycemia and hypoglycemia and how to treat hypoglycemia at home. Also reviewed blood sugar goals at home.  RNs to provide ongoing basic DM education at bedside with this patient. Reviewed insulin pen with patient per her request. Patient has been on Victoza pen in the past but currently has no insurance. Please consider Novolin 70/30 insulin ac bid for affordability if discharged on insulin.  Thank you, Billy FischerJudy E. Anushree Dorsi, RN, MSN, CDE  Diabetes Coordinator Inpatient Glycemic Control Team Team Pager [phone] (8am-5pm) 08/22/2016 12:15 PM

## 2016-08-22 NOTE — Consult Note (Signed)
Reason for Consult: Labial abscess Referring Physician: Lubertha Sayres Hannah [name] is an 56 y.o. female who was originally admitted to [facility] on 08/20/16 for labial abscess.  Pt states she has problems with chronic itching and abscess on her labial at times. Current Sx started last week. Was seen by OB/GYn last Friday and started on antibiotics. However the abscess area continue to worsen over the weekend. Presented to the hospital for further evaluation and management.  Per pt was started on antibiotics and was to have surgery but INR elevated and GYN did not feel comfort with surgery d/t pt's multiple medical problems. Transferred to [facility] hospitalitis service with GYN consult for further management.  Today pt reports feeling better, less pain.   Her multiple medical problems are well documented in her chart.  Pt reports never having children. Menopause several yrs ago. H/O endometrial hyperplasia with atypia. Unsuccessful IUD placement this past March. Was placed on daily Provera but pt self discontinued d/t to H/O DVT.    Menstrual History: Menarche age: 84 No LMP recorded.    Past Medical History:  Diagnosis Date  . Chronic diastolic (congestive) heart failure ([place])   . CKD (chronic kidney disease), stage III   . Diabetes mellitus without complication ([place])   . DVT (deep venous thrombosis) ([place])   . Essential hypertension   . Iron deficiency anemia     Past Surgical History:  Procedure Laterality Date  . BACK SURGERY      Family History  Problem Relation Age of Onset  . Hypertension Mother   . Diabetes Mellitus II Father   . Kidney disease Father     Social History:  reports that she has been smoking Cigarettes.  She has a 22.50 pack-year smoking history. She has never used smokeless tobacco. She reports that she does not drink alcohol or use drugs.  Allergies:  Allergies  Allergen Reactions  . Naproxen Other (See Comments)    Emotional and cramps     Medications: I have reviewed the patient's current medications.  Review of Systems  Constitutional: Negative.   Cardiovascular: Negative.   Gastrointestinal: Negative.   Genitourinary: Negative.     Blood pressure (!) 128/58, pulse 77, temperature 97.7 F (36.5 C), temperature source Oral, resp. rate 18, height 5' 8"  (1.727 m), weight 245 lb 9.6 oz (111.4 kg), SpO2 94 %. Physical Exam  Constitutional:  Obese white female in NAD  Cardiovascular: Normal rate and regular rhythm.   Respiratory: Effort normal and breath sounds normal.  GI: Soft. Bowel sounds are normal.  Genitourinary:  Genitourinary Comments: Left labial cyst noted, non tender and fluctuant, woody induration involving the left inguinal area and mons However no discrete abscess noted    Results for orders placed or performed during the hospital encounter of 08/21/16 (from the past 48 hour(s))  Lactic acid, plasma     Status: None   Collection Time: 08/21/16 11:58 PM  Result Value Ref Range   Lactic Acid, Venous 1.8 0.5 - 1.9 mmol/L  Protime-INR     Status: Abnormal   Collection Time: 08/21/16 11:58 PM  Result Value Ref Range   Prothrombin Time 29.5 (H) 11.4 - 15.2 seconds   INR 2.74   APTT     Status: Abnormal   Collection Time: 08/21/16 11:58 PM  Result Value Ref Range   aPTT 60 (H) 24 - 36 seconds    Comment:        IF BASELINE aPTT IS ELEVATED, SUGGEST  PATIENT RISK ASSESSMENT BE USED TO DETERMINE APPROPRIATE ANTICOAGULANT THERAPY.   CBC with Differential/Platelet     Status: Abnormal   Collection Time: 08/21/16 11:58 PM  Result Value Ref Range   WBC 19.3 (H) 4.0 - 10.5 K/uL   RBC 4.41 3.87 - 5.11 MIL/uL   Hemoglobin 12.8 12.0 - 15.0 g/dL   HCT 39.3 36.0 - 46.0 %   MCV 89.1 78.0 - 100.0 fL   MCH 29.0 26.0 - 34.0 pg   MCHC 32.6 30.0 - 36.0 g/dL   RDW 14.5 11.5 - 15.5 %   Platelets 250 150 - 400 K/uL   Neutrophils Relative % 88 %   Neutro Abs 16.9 (H) 1.7 - 7.7 K/uL   Lymphocytes Relative 6 %    Lymphs Abs 1.2 0.7 - 4.0 K/uL   Monocytes Relative 6 %   Monocytes Absolute 1.2 (H) 0.1 - 1.0 K/uL   Eosinophils Relative 0 %   Eosinophils Absolute 0.0 0.0 - 0.7 K/uL   Basophils Relative 0 %   Basophils Absolute 0.0 0.0 - 0.1 K/uL  Basic metabolic panel     Status: Abnormal   Collection Time: 08/21/16 11:58 PM  Result Value Ref Range   Sodium 137 135 - 145 mmol/L   Potassium 3.8 3.5 - 5.1 mmol/L   Chloride 105 101 - 111 mmol/L   CO2 20 (L) 22 - 32 mmol/L   Glucose, Bld 244 (H) 65 - 99 mg/dL   BUN 24 (H) 6 - 20 mg/dL   Creatinine, Ser 1.07 (H) 0.44 - 1.00 mg/dL   Calcium 8.8 (L) 8.9 - 10.3 mg/dL   GFR calc non Af Amer 56 (L) >60 mL/min   GFR calc Af Amer >60 >60 mL/min    Comment: (NOTE) The eGFR has been calculated using the CKD EPI equation. This calculation has not been validated in all clinical situations. eGFR's persistently <60 mL/min signify possible Chronic Kidney Disease.    Anion gap 12 5 - 15  Brain natriuretic peptide     Status: Abnormal   Collection Time: 08/21/16 11:58 PM  Result Value Ref Range   B Natriuretic Peptide 419.0 (H) 0.0 - 100.0 pg/mL  Glucose, capillary     Status: Abnormal   Collection Time: 08/22/16  2:33 AM  Result Value Ref Range   Glucose-Capillary 218 (H) 65 - 99 mg/dL  Basic metabolic panel     Status: Abnormal   Collection Time: 08/22/16  4:09 AM  Result Value Ref Range   Sodium 137 135 - 145 mmol/L   Potassium 3.8 3.5 - 5.1 mmol/L   Chloride 107 101 - 111 mmol/L   CO2 21 (L) 22 - 32 mmol/L   Glucose, Bld 201 (H) 65 - 99 mg/dL   BUN 25 (H) 6 - 20 mg/dL   Creatinine, Ser 0.99 0.44 - 1.00 mg/dL   Calcium 8.8 (L) 8.9 - 10.3 mg/dL   GFR calc non Af Amer >60 >60 mL/min   GFR calc Af Amer >60 >60 mL/min    Comment: (NOTE) The eGFR has been calculated using the CKD EPI equation. This calculation has not been validated in all clinical situations. eGFR's persistently <60 mL/min signify possible Chronic Kidney Disease.    Anion gap 9  5 - 15  CBC     Status: Abnormal   Collection Time: 08/22/16  4:09 AM  Result Value Ref Range   WBC 17.9 (H) 4.0 - 10.5 K/uL   RBC 4.31 3.87 - 5.11 MIL/uL  Hemoglobin 12.7 12.0 - 15.0 g/dL   HCT 38.6 36.0 - 46.0 %   MCV 89.6 78.0 - 100.0 fL   MCH 29.5 26.0 - 34.0 pg   MCHC 32.9 30.0 - 36.0 g/dL   RDW 14.5 11.5 - 15.5 %   Platelets 256 150 - 400 K/uL  Hemoglobin A1c     Status: Abnormal   Collection Time: 08/22/16  4:09 AM  Result Value Ref Range   Hgb A1c MFr Bld 11.9 (H) 4.8 - 5.6 %    Comment: (NOTE) Pre diabetes:          5.7%-6.4% Diabetes:              >6.4% Glycemic control for   <7.0% adults with diabetes    Mean Plasma Glucose 294.83 mg/dL  Glucose, capillary     Status: Abnormal   Collection Time: 08/22/16  8:13 AM  Result Value Ref Range   Glucose-Capillary 183 (H) 65 - 99 mg/dL  Protime-INR     Status: Abnormal   Collection Time: 08/22/16  9:22 AM  Result Value Ref Range   Prothrombin Time 26.4 (H) 11.4 - 15.2 seconds   INR 2.38   Glucose, capillary     Status: Abnormal   Collection Time: 08/22/16 10:02 AM  Result Value Ref Range   Glucose-Capillary 185 (H) 65 - 99 mg/dL  Glucose, capillary     Status: Abnormal   Collection Time: 08/22/16 12:11 PM  Result Value Ref Range   Glucose-Capillary 148 (H) 65 - 99 mg/dL    No results found.  Assessment/Plan: Left labial cyst without discrete abscess formation on PE  Recommend restarting Vancomycin and Zosyn.  WBC improving. Do not feel that the pt would benefit from surgery at present. Especially enlight of her chronic medical problems and potential for long term wound care. Recommend repeat CT scan Thursday or Friday, compare to scan performed at [facility] this past Monday. Hopefully she will continue to improve with conservative therapy. Removal of labial cyst can be address as an outpt if current inflammation process resolves. Will also need to address endometrial hyperplasia. But again this can be address as out  pt. Discussed with Dr. Annamaria Boots. On behalf of OB/GYN Faculty for [facility] [place] would like to thank [facility] for allow [place] to participate in Mrs [place] care. We will follow along with you. If you have any questions please contact us any time via this phone number [phone].  Chancy Milroy 08/22/2016

## 2016-08-22 NOTE — Progress Notes (Signed)
PROGRESS NOTE  Hannah [name] UJW:[id number] DOB: 01-13-1961 DOA: 08/21/2016 PCP: Anselmo Pickler, MD  HPI/Recap of past 24 hours:  Some pain associated wound, scat drainage, no fever Husband at bedside  Assessment/Plan: Principal Problem:   Labial abscess Active Problems:   Iron deficiency anemia   Essential hypertension   DVT (deep venous thrombosis) (HCC)   Diabetes mellitus with renal complications (HCC)   Cellulitis, perineum   Abscess of left axilla   Cellulitis of axilla, left   Chronic diastolic CHF (congestive heart failure) (HCC)   Supratherapeutic INR   CKD (chronic kidney disease), stage III  Sudden onset of substernal chest pain/epigastric pain after being repositioned in bed, vital stable, no hypoxia, on exam with mild epigastric tenderness, no guarding, no rebound, STAT EKG no acute interval changes compare to the on obtained on admission ( personally reviewed) will try pepcid, cycle troponin,  echocardiogram from outside hospital unremarkable   Labial abscess and perineal cellulitis, and abscess/cellulitis of left axillary area:  Currently patient is nonseptic. Lactic acid is normal. Hemodynamically stable.  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  Patient is taking Amaryl at home -SSI -diabetes education, will need to discuss with patient about starting insulin  Hx of DVT, recurent: on coumadin with supratherapeutic.  -check INR -hold coumadin now, heparin drip once inr donw  Chronic diastolic CHF (congestive heart failure) [facility]): Patient does not have leg edema JVD. CHF is compensated. 2-D echo on 08/21/16 showed EF 60-65%. In  [facility], after patient received 3rd units of FFP and one dose of Lasix, patient developed diffuse itchy and urticarial rash in her trunk, bilateral thighs. Not sure if pt is allergic to FFP or lasix. It may better to avoid using lasix in future. -check BNP  Hypertension: -IV hydralazine when necessary -Continue Cozaar,    CKD (chronic kidney disease), stage III: Stable. Creatinine is 1.07 on admission. To follow up renal function by BMP  Iron deficiency anemia:  -Continue iron supplement  Tobacco abuse: -Did counseling about importance of quitting smoking -Nicotine patch   Body mass index is 37.34 kg/m.  DVT ppx: None (pt has supratherapeutic INR), start heparin drip once inr trending down Code Status: Full code Family Communication: husband at bedside  Disposition Plan:  not ready for discharge  Consultants:  Gyn Dr Austin Miles   Procedures:  none  Antibiotics:  vanc/zosyn   Objective: BP (!) 128/58 (BP Location: Right Arm)   Pulse 77   Temp 97.7 F (36.5 C) (Oral)   Resp 18   Ht 5\' 8"  (1.727 m)   Wt 111.4 kg (245 lb 9.6 oz)   SpO2 94%   BMI 37.34 kg/m   Intake/Output Summary (Last 24 hours) at 08/22/16 1154 Last data filed at 08/22/16 0930  Gross per 24 hour  Intake                0 ml  Output              300 ml  Net             -300 ml   [employer]  08/21/16 2345 08/22/16 0500  Weight: 111.4 kg (245 lb 9.6 oz) 111.4 kg (245 lb 9.6 oz)    Exam: Patient is examined daily including today on 08/22/2016, exam remain the same as of yesterday except that is highlighted below   General:  Obese, NAD  Cardiovascular: RRR  Respiratory: CTABL  Abdomen: Soft/ND/NT, positive BS, induration, tender, warm to touch mons, left inguinal area, left labial   Musculoskeletal: No Edema  Neuro: aaox3  Data Reviewed: Basic Metabolic Panel:  Recent Labs Lab 08/21/16 2358 08/22/16 0409  NA 137 137  K 3.8 3.8  CL 105 107  CO2 20* 21*    GLUCOSE 244* 201*  BUN 24* 25*  CREATININE 1.07* 0.99  CALCIUM 8.8* 8.8*   Liver Function Tests: No results for input(s): AST, ALT, ALKPHOS, BILITOT, PROT, ALBUMIN in the last 168 hours. No results for input(s): LIPASE, AMYLASE in the last 168 hours. No results for input(s): AMMONIA in the last 168 hours. CBC:  Recent Labs Lab 08/21/16 2358 08/22/16 0409  WBC 19.3* 17.9*  NEUTROABS 16.9*  --   HGB 12.8 12.7  HCT 39.3 38.6  MCV 89.1 89.6  PLT 250 256   Cardiac Enzymes:   No results for input(s): CKTOTAL, CKMB, CKMBINDEX, TROPONINI in the last 168 hours. BNP (last 3 results)  Recent Labs  08/21/16 2358  BNP 419.0*    ProBNP (last 3 results) No results for input(s): PROBNP in the last 8760 hours.  CBG:  Recent Labs Lab 08/22/16 0233 08/22/16 0813 08/22/16 1002  GLUCAP 218* 183* 185*    No results found for this or any previous visit (from the past 240 hour(s)).   Studies: No results found.  Scheduled Meds: . clotrimazole   Topical BID  . ferrous sulfate  325 mg Oral Q breakfast  . insulin aspart  0-5 Units Subcutaneous QHS  . insulin aspart  0-9 Units Subcutaneous TID WC  . losartan  25 mg Oral Daily  . nicotine  21 mg Transdermal Daily  . pregabalin  100 mg Oral BID  . traZODone  150 mg Oral QHS    Continuous Infusions:   Time spent: 35 mins I have personally reviewed and interpreted daily labs, tele strips, imagings as discussed above under date review session and assessment and plans.  I have personally reviewed CT pel imaging through pacs system ( not available through epic due to done at a different hospital) that case discussed  And Ct imaging reveiwedwith gyn Dr Alysia PennaERvin in person  Lorilynn Lehr MD, PhD  [facility] Pager [phone]. If 7PM-7AM, please contact night-coverage at [email or web address], password [email or web address] 08/22/2016, 11:54 AM  LOS: 1 day

## 2016-08-22 NOTE — Consult Note (Addendum)
WOC Nurse wound consult note GYN team plans to perform surgery to labia abscess, according to the EMR.  No role for WOC team at this time, please refer to their team for further questions. Reason for Consult: Consult requested for left axilla wound.  Pt states it spontaneously ruptured and drained. Wound type: Full thickness wound over previous abscess site Measurement: .3X.8X.5cm with tunneling at 9:00 o'clock to .8cm Wound bed: narrow opening, unable to visualize but appears red and moist Drainage (amount, consistency, odor) mod amt tan drainage, no odor  Periwound: Erythremia surrounding to 2 cm Dressing procedure/placement/frequency: Iodoform packing to absorb drainage and promote healing.  Foam dressing to protect from further injury. Discussed plan of care with patient and she verbalized understanding. Please re-consult if further assistance is needed.  Thank-you,  Cammie Mcgeeawn Crysta Gulick MSN, RN, CWOCN, LyleWCN-AP, CNS [phone]

## 2016-08-23 LAB — CBC WITH DIFFERENTIAL/PLATELET
Basophils Absolute: 0.2 10*3/uL — ABNORMAL HIGH (ref 0.0–0.1)
Basophils Relative: 1 %
EOS ABS: 0.6 10*3/uL (ref 0.0–0.7)
EOS PCT: 4 %
HCT: 40.5 % (ref 36.0–46.0)
Hemoglobin: 13.3 g/dL (ref 12.0–15.0)
LYMPHS ABS: 2.4 10*3/uL (ref 0.7–4.0)
Lymphocytes Relative: 18 %
MCH: 29.4 pg (ref 26.0–34.0)
MCHC: 32.8 g/dL (ref 30.0–36.0)
MCV: 89.4 fL (ref 78.0–100.0)
MONO ABS: 0.8 10*3/uL (ref 0.1–1.0)
MONOS PCT: 6 %
Neutro Abs: 9.5 10*3/uL — ABNORMAL HIGH (ref 1.7–7.7)
Neutrophils Relative %: 71 %
PLATELETS: 283 10*3/uL (ref 150–400)
RBC: 4.53 MIL/uL (ref 3.87–5.11)
RDW: 14.4 % (ref 11.5–15.5)
WBC: 13.4 10*3/uL — ABNORMAL HIGH (ref 4.0–10.5)

## 2016-08-23 LAB — TROPONIN I
Troponin I: 0.06 ng/mL (ref <0.03)
Troponin I: <0.03 ng/mL (ref <0.03)

## 2016-08-23 LAB — URINE CULTURE: Culture: NO GROWTH

## 2016-08-23 LAB — GLUCOSE, CAPILLARY
GLUCOSE-CAPILLARY: 253 mg/dL — AB (ref 65–99)
GLUCOSE-CAPILLARY: 169 mg/dL — AB (ref 65–99)
GLUCOSE-CAPILLARY: 206 mg/dL — AB (ref 65–99)
GLUCOSE-CAPILLARY: 198 mg/dL — AB (ref 65–99)

## 2016-08-23 LAB — PROTIME-INR
INR: 1.97
PROTHROMBIN TIME: 22.7 s — AB (ref 11.4–15.2)

## 2016-08-23 LAB — HEPARIN LEVEL (UNFRACTIONATED): Heparin Unfractionated: <0.1 IU/mL — ABNORMAL LOW (ref 0.30–0.70)

## 2016-08-23 MED ORDER — POLYETHYLENE GLYCOL 3350 17 G PO PACK
17.0000 g | PACK | Freq: Every day | ORAL | Status: DC | PRN
  Administered 2016-08-23: 17 g via ORAL
  Filled 2016-08-23: qty 1

## 2016-08-23 MED ORDER — HEPARIN (PORCINE) IN NACL 100-0.45 UNIT/ML-% IJ SOLN
2400.0000 [IU]/h | INTRAMUSCULAR | Status: DC
  Administered 2016-08-23: 1300 [IU]/h via INTRAVENOUS
  Administered 2016-08-23: 1600 [IU]/h via INTRAVENOUS
  Administered 2016-08-24: 2000 [IU]/h via INTRAVENOUS
  Administered 2016-08-25: 2400 [IU]/h via INTRAVENOUS
  Administered 2016-08-25: 2000 [IU]/h via INTRAVENOUS
  Administered 2016-08-26 – 2016-08-29 (×7): 2400 [IU]/h via INTRAVENOUS
  Filled 2016-08-23 (×12): qty 250

## 2016-08-23 MED ORDER — HYDROMORPHONE HCL 1 MG/ML IJ SOLN
1.0000 mg | INTRAMUSCULAR | Status: DC | PRN
  Administered 2016-08-23: 2 mg via INTRAVENOUS
  Administered 2016-08-23: 1 mg via INTRAVENOUS
  Administered 2016-08-23: 2 mg via INTRAVENOUS
  Administered 2016-08-24: 1 mg via INTRAVENOUS
  Administered 2016-08-24 (×2): 2 mg via INTRAVENOUS
  Administered 2016-08-25 – 2016-08-26 (×5): 1 mg via INTRAVENOUS
  Filled 2016-08-23: qty 1
  Filled 2016-08-23 (×3): qty 2
  Filled 2016-08-23 (×4): qty 1
  Filled 2016-08-23: qty 2
  Filled 2016-08-23: qty 1
  Filled 2016-08-23: qty 2

## 2016-08-23 MED ORDER — SENNOSIDES-DOCUSATE SODIUM 8.6-50 MG PO TABS
1.0000 | ORAL_TABLET | Freq: Every day | ORAL | Status: DC
  Administered 2016-08-24: 1 via ORAL
  Filled 2016-08-23 (×2): qty 1

## 2016-08-23 NOTE — Progress Notes (Signed)
PROGRESS NOTE  Hannah [name] BJY:[id number] DOB: October 23, 1960 DOA: 08/21/2016 PCP: Anselmo Pickler, MD  HPI/Recap of past 24 hours:  Report pain become more sever, no drainage, no fever   Assessment/Plan: Principal Problem:   Labial abscess Active Problems:   Iron deficiency anemia   Essential hypertension   DVT (deep venous thrombosis) (HCC)   Diabetes mellitus with renal complications (HCC)   Cellulitis, perineum   Abscess of left axilla   Cellulitis of axilla, left   Chronic diastolic CHF (congestive heart failure) (HCC)   Supratherapeutic INR   CKD (chronic kidney disease), stage III  Sudden onset of substernal chest pain/epigastric pain after being repositioned in bed, vital stable, no hypoxia, on exam with mild epigastric tenderness, no guarding, no rebound, STAT EKG no acute interval changes compare to the on obtained on admission ( personally reviewed) will try pepcid, cycle troponin,  echocardiogram from outside hospital unremarkable   Labial abscess and perineal cellulitis, and abscess/cellulitis of left axillary area:  Currently patient is nonseptic. Lactic acid is normal. Hemodynamically stable.  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  Patient is taking Amaryl at home -SSI -diabetes education, I have discussed with  patient about diabetes disease management and  Plan to start her on  Insulin, she report currently does not have insurance, case manager consulted.   Hx of DVT, recurent: on coumadin with supratherapeutic.  -check INR -hold coumadin now, heparin drip started on 8/9  once inr down  Chronic diastolic CHF (congestive heart failure) [facility]): Patient does not have leg edema JVD. CHF is compensated. 2-D echo on 08/21/16 showed EF 60-65%. In [facility], after patient received 3rd units of FFP and one dose of Lasix, patient developed diffuse itchy and urticarial rash in her trunk, bilateral thighs. Not sure if pt is allergic to FFP or lasix. It may better to avoid using lasix in future. -BNP 419  Hypertension: -IV hydralazine when necessary -Continue Cozaar,    CKD (chronic kidney disease), stage III: Stable. Creatinine is 1.07 on admission. To follow up renal function by BMP  Iron deficiency anemia:  -Continue iron supplement  Tobacco abuse: -Did counseling about importance of quitting smoking -Nicotine patch   Body mass index is 37.5 kg/m.  DVT ppx: admitted with supratherapeutic INR,  heparin drip started on 8/9 once inr trending down Code Status: Full code Family Communication: patient  Disposition Plan:  not ready for discharge  Consultants:  Gyn Dr Austin Miles   Procedures:  none  Antibiotics:  vanc/zosyn   Objective: BP (!) 132/56 (BP Location: Right Arm)   Pulse 84   Temp 98.4 F (36.9 C) (Oral)   Resp 18   Ht 5\' 8"  (1.727 m)   Wt 111.9 kg (246 lb 9.6 oz)   SpO2 98%   BMI 37.50 kg/m   Intake/Output Summary (Last 24 hours) at 08/23/16 1804 Last data filed at 08/23/16 1540  Gross per 24 hour  Intake              694 ml  Output  1400 ml  Net             -706 ml   Filed Weights   08/21/16 2345 08/22/16 0500 08/23/16 0410  Weight: 111.4 kg (245 lb 9.6 oz) 111.4 kg (245 lb 9.6 oz) 111.9 kg (246 lb 9.6 oz)    Exam: Patient is examined daily including today on 08/23/2016, exam remain the same as of yesterday except that is highlighted below   General:  Obese, NAD  Cardiovascular: RRR  Respiratory: CTABL  Abdomen: Soft/ND/NT, positive BS, induration, tender, warm to touch mons, left inguinal  area, left labial   Musculoskeletal: No Edema  Neuro: aaox3  Data Reviewed: Basic Metabolic Panel:  Recent Labs Lab 08/21/16 2358 08/22/16 0409  NA 137 137  K 3.8 3.8  CL 105 107  CO2 20* 21*  GLUCOSE 244* 201*  BUN 24* 25*  CREATININE 1.07* 0.99  CALCIUM 8.8* 8.8*   Liver Function Tests: No results for input(s): AST, ALT, ALKPHOS, BILITOT, PROT, ALBUMIN in the last 168 hours. No results for input(s): LIPASE, AMYLASE in the last 168 hours. No results for input(s): AMMONIA in the last 168 hours. CBC:  Recent Labs Lab 08/21/16 2358 08/22/16 0409 08/23/16 1027  WBC 19.3* 17.9* 13.4*  NEUTROABS 16.9*  --  9.5*  HGB 12.8 12.7 13.3  HCT 39.3 38.6 40.5  MCV 89.1 89.6 89.4  PLT 250 256 283   Cardiac Enzymes:    Recent Labs Lab 08/22/16 1814 08/22/16 2243 08/23/16 0515  TROPONINI 0.09* 0.06* <0.03   BNP (last 3 results)  Recent Labs  08/21/16 2358  BNP 419.0*    ProBNP (last 3 results) No results for input(s): PROBNP in the last 8760 hours.  CBG:  Recent Labs Lab 08/22/16 1815 08/22/16 2041 08/23/16 0753 08/23/16 1245 08/23/16 1728  GLUCAP 264* 214* 253* 169* 206*    Recent Results (from the past 240 hour(s))  Urine Culture     Status: None   Collection Time: 08/21/16 11:15 PM  Result Value Ref Range Status   Specimen Description URINE, RANDOM  Final   Special Requests NONE  Final   Culture NO GROWTH  Final   Report Status 08/23/2016 FINAL  Final  Culture, blood (Routine X 2) w Reflex to ID Panel     Status: None (Preliminary result)   Collection Time: 08/21/16 11:58 PM  Result Value Ref Range Status   Specimen Description BLOOD RIGHT HAND  Final   Special Requests   Final    BOTTLES DRAWN AEROBIC AND ANAEROBIC Blood Culture adequate volume   Culture NO GROWTH 1 DAY  Final   Report Status PENDING  Incomplete  Culture, blood (Routine X 2) w Reflex to ID Panel     Status: None (Preliminary result)   Collection Time: 08/22/16 12:08 AM    Result Value Ref Range Status   Specimen Description BLOOD LEFT HAND  Final   Special Requests   Final    BOTTLES DRAWN AEROBIC AND ANAEROBIC Blood Culture adequate volume   Culture NO GROWTH 1 DAY  Final   Report Status PENDING  Incomplete     Studies: No results found.  Scheduled Meds: . clotrimazole   Topical BID  . famotidine  20 mg Oral BID  . ferrous sulfate  325 mg Oral Q breakfast  . insulin aspart  0-5 Units Subcutaneous QHS  . insulin aspart  0-9 Units Subcutaneous TID WC  . losartan  25 mg Oral Daily  . nicotine  21  mg Transdermal Daily  . pregabalin  100 mg Oral BID  . senna-docusate  1 tablet Oral QHS  . traZODone  150 mg Oral QHS    Continuous Infusions: . heparin 1,300 Units/hr (08/23/16 0820)  . piperacillin-tazobactam (ZOSYN)  IV Stopped (08/23/16 1656)  . vancomycin 750 mg (08/23/16 1726)     Time spent: 35 mins I have personally reviewed and interpreted daily labs, tele strips, as discussed above under date review session and assessment and plans.   Daire Okimoto MD, PhD  [facility] Pager [phone]. If 7PM-7AM, please contact night-coverage at [email or web address], password [facility] 08/23/2016, 6:04 PM  LOS: 2 days

## 2016-08-23 NOTE — Progress Notes (Signed)
ANTICOAGULATION CONSULT NOTE   Pharmacy Consult for Heparin Indication: h/o DVT  Allergies  Allergen Reactions  . Naproxen Other (See Comments)    Emotional and cramps    Patient Measurements: Height: 5\' 8"  (172.7 cm) Weight: 246 lb 9.6 oz (111.9 kg) IBW/kg (Calculated) : 63.9 Heparin Dosing Weight: 90 kg  Vital Signs: Temp: 98.4 F (36.9 C) (08/09 1540) Temp Source: Oral (08/09 1540) BP: 132/56 (08/09 1540) Pulse Rate: 84 (08/09 1540)  Labs:  Recent Labs  08/21/16 2358 08/22/16 0409 08/22/16 0922 08/22/16 1814 08/22/16 2243 08/23/16 0515 08/23/16 1027 08/23/16 1639  HGB 12.8 12.7  --   --   --   --  13.3  --   HCT 39.3 38.6  --   --   --   --  40.5  --   PLT 250 256  --   --   --   --  283  --   APTT 60*  --   --   --   --   --   --   --   LABPROT 29.5*  --  26.4*  --   --  22.7*  --   --   INR 2.74  --  2.38  --   --  1.97  --   --   HEPARINUNFRC  --   --   --   --   --   --   --  <0.10*  CREATININE 1.07* 0.99  --   --   --   --   --   --   TROPONINI  --   --   --  0.09* 0.06* <0.03  --   --     Estimated Creatinine Clearance: 81.3 mL/min (by C-G formula based on SCr of 0.99 mg/dL).  Assessment: 56 y.o. female with h/o DVT, Coumadin on hold on IV heparin and initial heparin level is undetectable -INR= 1.97  Goal of Therapy:  Heparin level 0.3-0.7 units/ml Monitor platelets by anticoagulation protocol: Yes   Plan: -no heparin bolus due to elevated INR -Increase heparin to 1600 units/hr -Heparin level in 6 hours and daily wth CBC daily  Harland Germanndrew Maicie Vanderloop, Pharm D 08/23/2016 6:22 PM

## 2016-08-23 NOTE — Progress Notes (Signed)
ANTICOAGULATION CONSULT NOTE - Initial Consult  Pharmacy Consult for Heparin Indication: h/o DVT  Allergies  Allergen Reactions  . Naproxen Other (See Comments)    Emotional and cramps    Patient Measurements: Height: 5\' 8"  (172.7 cm) Weight: 246 lb 9.6 oz (111.9 kg) IBW/kg (Calculated) : 63.9 Heparin Dosing Weight: 90 kg  Vital Signs: Temp: 98.4 F (36.9 C) (08/09 0505) Temp Source: Oral (08/09 0505) BP: 135/64 (08/09 0505) Pulse Rate: 84 (08/09 0505)  Labs:  Recent Labs  08/21/16 2358 08/22/16 0409 08/22/16 0922 08/22/16 1814 08/22/16 2243 08/23/16 0515  HGB 12.8 12.7  --   --   --   --   HCT 39.3 38.6  --   --   --   --   PLT 250 256  --   --   --   --   APTT 60*  --   --   --   --   --   LABPROT 29.5*  --  26.4*  --   --  22.7*  INR 2.74  --  2.38  --   --  1.97  CREATININE 1.07* 0.99  --   --   --   --   TROPONINI  --   --   --  0.09* 0.06* <0.03    Estimated Creatinine Clearance: 81.3 mL/min (by C-G formula based on SCr of 0.99 mg/dL).  Assessment: 56 y.o. female with h/o DVT, Coumadin on hold, for heparin.  INR now subtherapeutic Goal of Therapy:  Heparin level 0.3-0.7 units/ml Monitor platelets by anticoagulation protocol: Yes   Plan:  Start heparin 1300 units/hr Check heparin level in 8 hours.    Jastin Fore, Gary FleetGregory Vernon 08/23/2016,7:55 AM

## 2016-08-23 NOTE — Care Management Note (Signed)
Case Management Note  Patient Details  Name: Hannah [name] MRN: [id number] Date of Birth: 1960/12/20  Subjective/Objective:     Admitted secondary to abscess and cellulitis in perineal and L axillar areas. PMH including but not limited to HTN, depression, DM, CHF and CKD.                Action/Plan:   CM received consult : No insurance, need to discharge home on insulin   CM received consult: Expected Discharge Date:                  Expected Discharge Plan:  Home/Self Care  In-House Referral:     Discharge planning Services  CM Consult, [facility]  Post Acute Care Choice:    Choice offered to:  Patient  DME Arranged:    DME Agency:     HH Arranged:    HH Agency:     Status of Service:  Completed, signed off  If discussed at [employer] Meetings, dates discussed:    Additional Comments:  Epifanio LeschesCole, Cottrell Gentles Hudson, RN 08/23/2016, 3:43 PM

## 2016-08-23 NOTE — Progress Notes (Signed)
GYN  Pt continues to have some left groin pain.  PE AF  GU woody induration of labia majora and extending into the mons, no discrete abscess formation, labia cyst unchanged, no evidence of abscess formation  A/P Celluitis  I think this is more of a chronic cellulitis situation secondary to heradenitis. Will recheck CBC with diff in AM. Adjust pain medication. Continue with Sx treatment with warm compresses and heating pad. Discussed with radiology. CT [facility] did not demonstrate a frank abscess formation. There was a question of such but appears to be related to labial cyst. Inflammatory changes noted. Will repeat CT scan tomorrow for comparison. If drainable abscess noted, would consider IR instead of OR due to open wound and long term healing process. If only inflammation, would continue with IV antibiotics. Discussed POC with pt.

## 2016-08-23 NOTE — Progress Notes (Signed)
INR 1.97 today, Dr. Roda ShuttersXu notified

## 2016-08-23 NOTE — Evaluation (Signed)
Physical Therapy Evaluation Patient Details Name: Hannah [name] MRN: [id number] DOB: 06/19/1960 Today's Date: 08/23/2016   History of Present Illness  Pt is a 56 y/o female admitted secondary to abscess and cellulitis in perineal and L axillar areas. PMH including but not limited to HTN, depression, DM, CHF and CKD.  Clinical Impression  Pt presented supine in bed with HOB elevated, awake and willing to participate in therapy session. Prior to admission, pt reported that she was independent with all functional mobility and ADLs. Pt limited this session secondary to groin pain with movement. Pt ambulated within her room with use of RW and min guard for safety. PT will continue to follow acutely for mobility progression and to ensure a safe d/c home.    Follow Up Recommendations No PT follow up    Equipment Recommendations  Rolling walker with 5" wheels    Recommendations for Other Services       Precautions / Restrictions Restrictions Weight Bearing Restrictions: No      Mobility  Bed Mobility Overal bed mobility: Needs Assistance Bed Mobility: Supine to Sit;Sit to Supine     Supine to sit: Mod assist;HOB elevated Sit to supine: Min guard   General bed mobility comments: increased time and effort, use of bed rails, physical assist to elevate trunk  Transfers Overall transfer level: Needs assistance Equipment used: None Transfers: Sit to/from Stand Sit to Stand: Supervision         General transfer comment: increased time, supervision for safety  Ambulation/Gait Ambulation/Gait assistance: Min guard Ambulation Distance (Feet): 40 Feet Assistive device: Rolling walker (2 wheeled) Gait Pattern/deviations: Step-through pattern;Decreased step length - right;Decreased step length - left;Decreased stride length;Antalgic Gait velocity: decreased Gait velocity interpretation: Below normal speed for age/gender General Gait Details: pt with modest instability but no overt  LOB or need for physical assistance, cueing for safety with use of RW  Stairs            Wheelchair Mobility    Modified Rankin (Stroke Patients Only)       Balance Overall balance assessment: Needs assistance Sitting-balance support: Feet supported Sitting balance-Leahy Scale: Good     Standing balance support: During functional activity;No upper extremity supported Standing balance-Leahy Scale: Fair                               Pertinent Vitals/Pain Pain Assessment: Faces Faces Pain Scale: Hurts whole lot Pain Location: L groin Pain Descriptors / Indicators: Sore;Grimacing;Guarding Pain Intervention(s): Monitored during session;Repositioned    Home Living Family/patient expects to be discharged to:: Private residence Living Arrangements: Spouse/significant other Available Help at Discharge: Family;Available 24 hours/day Type of Home: House Home Access: Stairs to enter Entrance Stairs-Rails: None Entrance Stairs-Number of Steps: 3 Home Layout: One level Home Equipment: Bedside commode      Prior Function Level of Independence: Independent               Hand Dominance        Extremity/Trunk Assessment   Upper Extremity Assessment Upper Extremity Assessment: Overall WFL for tasks assessed    Lower Extremity Assessment Lower Extremity Assessment: Overall WFL for tasks assessed       Communication   Communication: No difficulties  Cognition Arousal/Alertness: Awake/alert Behavior During Therapy: WFL for tasks assessed/performed Overall Cognitive Status: Within Functional Limits for tasks assessed  General Comments      Exercises     Assessment/Plan    PT Assessment Patient needs continued PT services  PT Problem List Decreased balance;Decreased mobility;Decreased coordination;Decreased knowledge of use of DME;Decreased safety awareness;Pain       PT Treatment  Interventions DME instruction;Gait training;Stair training;Functional mobility training;Therapeutic activities;Balance training;Therapeutic exercise;Neuromuscular re-education;Patient/family education    PT Goals (Current goals can be found in the Care Plan section)  Acute Rehab PT Goals Patient Stated Goal: decrease pain PT Goal Formulation: With patient Time For Goal Achievement: 09/06/16 Potential to Achieve Goals: Good    Frequency Min 3X/week   Barriers to discharge        Co-evaluation               AM-PAC PT "6 Clicks" Daily Activity  Outcome Measure Difficulty turning over in bed (including adjusting bedclothes, sheets and blankets)?: None Difficulty moving from lying on back to sitting on the side of the bed? : None Difficulty sitting down on and standing up from a chair with arms (e.g., wheelchair, bedside commode, etc,.)?: A Little Help needed moving to and from a bed to chair (including a wheelchair)?: A Little Help needed walking in hospital room?: A Little Help needed climbing 3-5 steps with a railing? : A Little 6 Click Score: 20    End of Session   Activity Tolerance: Patient limited by pain Patient left: in bed;with call bell/phone within reach Nurse Communication: Mobility status PT Visit Diagnosis: Other abnormalities of gait and mobility (R26.89);Pain Pain - part of body:  (groin)    Time: 1610-9604 PT Time Calculation (min) (ACUTE ONLY): 18 min   Charges:   PT Evaluation $PT Eval Moderate Complexity: 1 Mod     PT G Codes:        [place], PT, DPT [phone]   Alessandra Bevels Indiah Heyden 08/23/2016, 1:28 PM

## 2016-08-24 ENCOUNTER — Inpatient Hospital Stay (HOSPITAL_COMMUNITY): Payer: Medicaid Other

## 2016-08-24 LAB — BASIC METABOLIC PANEL
Anion gap: 13 (ref 5–15)
BUN: 21 mg/dL — AB (ref 6–20)
CHLORIDE: 103 mmol/L (ref 101–111)
CO2: 21 mmol/L — AB (ref 22–32)
CREATININE: 1 mg/dL (ref 0.44–1.00)
Calcium: 8.9 mg/dL (ref 8.9–10.3)
GFR calc Af Amer: >60 mL/min (ref >60)
GFR calc non Af Amer: >60 mL/min (ref >60)
Glucose, Bld: 242 mg/dL — ABNORMAL HIGH (ref 65–99)
Potassium: 4.2 mmol/L (ref 3.5–5.1)
Sodium: 137 mmol/L (ref 135–145)

## 2016-08-24 LAB — CBC WITH DIFFERENTIAL/PLATELET
BASOS PCT: 0 %
Basophils Absolute: 0 10*3/uL (ref 0.0–0.1)
EOS ABS: 0.4 10*3/uL (ref 0.0–0.7)
EOS PCT: 2 %
HCT: 45.6 % (ref 36.0–46.0)
HEMOGLOBIN: 15.5 g/dL — AB (ref 12.0–15.0)
LYMPHS ABS: 3 10*3/uL (ref 0.7–4.0)
Lymphocytes Relative: 16 %
MCH: 31 pg (ref 26.0–34.0)
MCHC: 34 g/dL (ref 30.0–36.0)
MCV: 91.2 fL (ref 78.0–100.0)
Monocytes Absolute: 1.3 10*3/uL — ABNORMAL HIGH (ref 0.1–1.0)
Monocytes Relative: 7 %
Neutro Abs: 14.1 10*3/uL — ABNORMAL HIGH (ref 1.7–7.7)
Neutrophils Relative %: 75 %
Platelets: 278 10*3/uL (ref 150–400)
RBC: 5 MIL/uL (ref 3.87–5.11)
RDW: 14.7 % (ref 11.5–15.5)
WBC: 18.8 10*3/uL — ABNORMAL HIGH (ref 4.0–10.5)

## 2016-08-24 LAB — GLUCOSE, CAPILLARY
GLUCOSE-CAPILLARY: 248 mg/dL — AB (ref 65–99)
GLUCOSE-CAPILLARY: 207 mg/dL — AB (ref 65–99)
Glucose-Capillary: 265 mg/dL — ABNORMAL HIGH (ref 65–99)
Glucose-Capillary: 233 mg/dL — ABNORMAL HIGH (ref 65–99)

## 2016-08-24 LAB — VANCOMYCIN, TROUGH: VANCOMYCIN TR: 10 ug/mL — AB (ref 15–20)

## 2016-08-24 LAB — HEPARIN LEVEL (UNFRACTIONATED): HEPARIN UNFRACTIONATED: 0.34 [IU]/mL (ref 0.30–0.70)

## 2016-08-24 MED ORDER — IOPAMIDOL (ISOVUE-300) INJECTION 61%
INTRAVENOUS | Status: AC
  Administered 2016-08-24: 75 mL via INTRAVENOUS
  Filled 2016-08-24: qty 100

## 2016-08-24 MED ORDER — VANCOMYCIN HCL IN DEXTROSE 1-5 GM/200ML-% IV SOLN
1000.0000 mg | Freq: Two times a day (BID) | INTRAVENOUS | Status: DC
  Administered 2016-08-24 – 2016-08-31 (×14): 1000 mg via INTRAVENOUS
  Filled 2016-08-24 (×15): qty 200

## 2016-08-24 NOTE — Progress Notes (Signed)
ANTICOAGULATION CONSULT NOTE - Follow Up Consult  Pharmacy Consult for heparin Indication: History of DVT  Allergies  Allergen Reactions  . Naproxen Other (See Comments)    Emotional and cramps    Patient Measurements: Height: 5\' 8"  (172.7 cm) Weight: 252 lb 3.2 oz (114.4 kg) IBW/kg (Calculated) : 63.9 Heparin Dosing Weight: 89.3 kg  Vital Signs: Temp: 99.6 F (37.6 C) (08/10 0457) Temp Source: Oral (08/10 0457) BP: 136/58 (08/10 0457) Pulse Rate: 96 (08/10 0457)  Labs:  Recent Labs  08/21/16 2358 08/22/16 0409 08/22/16 [id number] 08/22/16 1814 08/22/16 2243 08/23/16 0515 08/23/16 1027 08/23/16 1639 08/24/16 0320 08/24/16 1049  HGB 12.8 12.7  --   --   --   --  13.3  --  15.5*  --   HCT 39.3 38.6  --   --   --   --  40.5  --  45.6  --   PLT 250 256  --   --   --   --  283  --  278  --   APTT 60*  --   --   --   --   --   --   --   --   --   LABPROT 29.5*  --  26.4*  --   --  22.7*  --   --   --   --   INR 2.74  --  2.38  --   --  1.97  --   --   --   --   HEPARINUNFRC  --   --   --   --   --   --   --  <0.10* <0.10* 0.34  CREATININE 1.07* 0.99  --   --   --   --   --   --  1.00  --   TROPONINI  --   --   --  0.09* 0.06* <0.03  --   --   --   --     Estimated Creatinine Clearance: 81.4 mL/min (by C-G formula based on SCr of 1 mg/dL).  Assessment: 56 YOF on Coumadin PTA for hx DVT, Transitioning to heparin pending need for surgery.  Rec'd Vti K 8/7 (see even summary above) . HL 0.34 on 2000 units/hr CBC stable, No bleeding noted per chart.   Goal of Therapy:  Heparin level 0.3-0.7 units/ml Monitor platelets by anticoagulation protocol: Yes   Plan:  Continue heparin 2000 units/hr F/u AM labs F/u plans regarding surgery and transition back to coumadin when appropriate.  Bayard HuggerMei Kenlee Maler, PharmD, BCPS  Clinical Pharmacist  Pager: [phone]   08/24/2016,12:51 PM

## 2016-08-24 NOTE — Progress Notes (Signed)
Pharmacy Antibiotic and Anticoagulation Note  Hannah [name] is a 56 y.o. female transferred from [facility] on 08/21/2016 with axilla and labial abscesses.  Pharmacy has been consulted for Vancomycin and Zosyn dosing.  -vancomycin trough= 10 at ~ 8pm (last dose was 750mg  at ~ 10am) -WBC= 18.8, afeb, CrCl ~ 80  Events PTA:  8/3 started on Bactrim PO by PCP (interaction with Coumadin) 8/6 [facility] to [facility] with worsening abscess, INR 13 8/6 started Vanc 2gm IV q18h and Zosyn 3.375gm IV q6h (30 min infusion) 8/7 @ 1330 rec'd 10mg  SQ Vit K 8/7 PM Tx [place]   Plan:  Change Vancomycin to 1000 mg IV q12h (goal trough 10-15) Continue Zosyn 3.375 gm IV q8h (4 hour infusion).  Height: 5\' 8"  (172.7 cm) Weight: 252 lb 3.2 oz (114.4 kg) IBW/kg (Calculated) : 63.9  Temp (24hrs), Avg:98.9 F (37.2 C), Min:98.4 F (36.9 C), Max:99.6 F (37.6 C)   Recent Labs Lab 08/21/16 2358 08/22/16 0409 08/23/16 1027 08/24/16 0320 08/24/16 1955  WBC 19.3* 17.9* 13.4* 18.8*  --   CREATININE 1.07* 0.99  --  1.00  --   LATICACIDVEN 1.8  --   --   --   --   VANCOTROUGH  --   --   --   --  10*    Estimated Creatinine Clearance: 81.4 mL/min (by C-G formula based on SCr of 1 mg/dL).    Lab Results  Component Value Date   INR 1.97 08/23/2016   INR 2.38 08/22/2016   INR 2.74 08/21/2016     Allergies  Allergen Reactions  . Naproxen Other (See Comments)    Emotional and cramps    Antimicrobials this admission: Bactrim outpt PTA 8/3 >>8/6 Vanc 8/6 >> * 2gm q18h at [facility] - last dose 8/7 @ 1130 Zosyn 8/6 >> * 3.375gm IV q6h at [facility] - last dose 8/7 @ 2000  Dose adjustments this admission:   Microbiology results: 8/8 blood x 2 >> ngtd 8/7 urine >> neg  Thank you for allowing pharmacy to be a part of this patient's care.  Harland GermanAndrew Rejoice Heatwole, Pharm D 08/24/2016 9:10 PM

## 2016-08-24 NOTE — Progress Notes (Signed)
ANTICOAGULATION CONSULT NOTE - Follow Up Consult  Pharmacy Consult for heparin Indication: h/o DVT  Labs:  Recent Labs  08/21/16 2358 08/22/16 0409 08/22/16 0922 08/22/16 1814 08/22/16 2243 08/23/16 0515 08/23/16 1027 08/23/16 1639 08/24/16 0320  HGB 12.8 12.7  --   --   --   --  13.3  --  15.5*  HCT 39.3 38.6  --   --   --   --  40.5  --  45.6  PLT 250 256  --   --   --   --  283  --  278  APTT 60*  --   --   --   --   --   --   --   --   LABPROT 29.5*  --  26.4*  --   --  22.7*  --   --   --   INR 2.74  --  2.38  --   --  1.97  --   --   --   HEPARINUNFRC  --   --   --   --   --   --   --  <0.10* <0.10*  CREATININE 1.07* 0.99  --   --   --   --   --   --  1.00  TROPONINI  --   --   --  0.09* 0.06* <0.03  --   --   --     Assessment: 56yo female remains undetectable on heparin after rate increase; no gtt issues per RN.  Goal of Therapy:  Heparin level 0.3-0.7 units/ml   Plan:  Will increase heparin gtt by 4 units/kg/hr to 2000 units/hr and check level in 6hr.  Hannah [name], PharmD, BCPS  08/24/2016,4:41 AM

## 2016-08-24 NOTE — Progress Notes (Signed)
I have reviewed her CT findings. There still does not appear to be an area which is easily drained. Continue Abx for now.

## 2016-08-24 NOTE — Progress Notes (Signed)
Inpatient Diabetes Program Recommendations  AACE/ADA: New Consensus Statement on Inpatient Glycemic Control (2015)  Target Ranges:  Prepandial:   less than 140 mg/dL      Peak postprandial:   less than 180 mg/dL (1-2 hours)      Critically ill patients:  140 - 180 mg/dL   Results for Hannah [name], Hannah [name] (MRN [id number]) as of 08/24/2016 09:58  Ref. Range 08/23/2016 07:53 08/23/2016 12:45 08/23/2016 17:28 08/23/2016 21:04 08/24/2016 08:50  Glucose-Capillary Latest Ref Range: 65 - 99 mg/dL 191253 (H) 478169 (H) 295206 (H) 198 (H) 248 (H)   Review of Glycemic Control  Inpatient Diabetes Program Recommendations:    Glucose in the 200's a lot of the time. Patient may need long acting insulin on board.  Please consider Novolin 70/30 insulin bid for affordability if discharged on insulin. Recommend starting with 8 units BID (11 units basal insulin) (0.1unit/kg).  Thanks,  Christena DeemShannon Broghan Pannone RN, MSN, [facility] Inpatient Diabetes Coordinator Team Pager [phone] (8a-5p)

## 2016-08-24 NOTE — Progress Notes (Signed)
PROGRESS NOTE  Hannah [name] ZOX:[id number] DOB: 1960-04-19 DOA: 08/21/2016 PCP: Anselmo Pickler, MD  HPI/Recap of past 24 hours:  Not improving, pain become more sever, no drainage, no fever   Assessment/Plan: Principal Problem:   Labial abscess Active Problems:   Iron deficiency anemia   Essential hypertension   DVT (deep venous thrombosis) (HCC)   Diabetes mellitus with renal complications (HCC)   Cellulitis, perineum   Abscess of left axilla   Cellulitis of axilla, left   Chronic diastolic CHF (congestive heart failure) (HCC)   Supratherapeutic INR   CKD (chronic kidney disease), stage III    Labial abscess and perineal cellulitis, and abscess/cellulitis of left axillary area:  -Currently patient is nonseptic. Lactic acid is normal. Hemodynamically stable.  -- - Blood cultures x 2 no growth - wound care consult -OB/GYN input appreciated, continue iv abx for now, repeat ct pel with contrast on 8/10 remain significant inflammation/infection, but no drainable collection.  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 Patient is taking Amaryl at home -SSI -diabetes education, I have discussed with  patient about diabetes disease management and  Plan to start her on  Insulin, she report currently does not have insurance, case manager consulted.   Hx of DVT, recurent: on coumadin with supratherapeutic.  --hold coumadin now, heparin drip started on 8/9 once inr down  Chronic diastolic CHF (congestive heart failure) [facility]): Patient does not have leg edema JVD. CHF is compensated. 2-D echo on 08/21/16 showed EF 60-65%. In [facility], after patient received 3rd units of FFP and one dose of Lasix, patient developed diffuse itchy and urticarial rash in her trunk, bilateral thighs. Not sure if pt is allergic to FFP or lasix. It may better to avoid  using lasix in future. -BNP 419  Sudden onset of substernal chest pain/epigastric pain after being repositioned in bed on 8/9, vital stable, no hypoxia, on exam with mild epigastric tenderness, no guarding, no rebound, STAT EKG no acute interval changes compare to the on obtained on admission ( personally reviewed) cycle troponin flat  echocardiogram from outside hospital unremarkable Improved on pepcid  Hypertension: -IV hydralazine when necessary -Continue Cozaar,   CKD (chronic kidney disease), stage III: Stable. Creatinine is 1.07 on admission. To follow up renal function by BMP  Iron deficiency anemia:  -Continue iron supplement  Tobacco abuse: -Did counseling about importance of quitting smoking -Nicotine patch   Body mass index is 38.35 kg/m.  DVT ppx: admitted with supratherapeutic INR,  heparin drip started on 8/9 once inr trending down Code Status: Full code Family Communication: patient  Disposition Plan:  not ready for discharge  Consultants:  Gyn   Procedures:  none  Antibiotics:  vanc/zosyn   Objective: BP 134/60 (BP Location: Left Arm)   Pulse 96   Temp 98.6 F (37 C) (Oral)   Resp 18   Ht 5\' 8"  (1.727 m)   Wt 114.4 kg (252 lb 3.2 oz)   SpO2 99%   BMI 38.35 kg/m   Intake/Output Summary (Last 24 hours) at 08/24/16 1946 Last data filed at 08/24/16 1817  Gross per 24 hour  Intake           814.73 ml  Output             2600 ml  Net         -1785.27 ml   Ceasar Mons  Weights   08/23/16 0410 08/23/16 2144 08/24/16 0457  Weight: 111.9 kg (246 lb 9.6 oz) 113.9 kg (251 lb) 114.4 kg (252 lb 3.2 oz)    Exam: Patient is examined daily including today on 08/24/2016, exam remain the same as of yesterday except that is changed   General:  Obese, NAD  Cardiovascular: RRR  Respiratory: CTABL  Abdomen: Soft/ND/NT, positive BS, induration, tender, warm to touch mons, left inguinal area, left labial   Musculoskeletal: No Edema  Neuro:  aaox3  Data Reviewed: Basic Metabolic Panel:  Recent Labs Lab 08/21/16 2358 08/22/16 0409 08/24/16 0320  NA 137 137 137  K 3.8 3.8 4.2  CL 105 107 103  CO2 20* 21* 21*  GLUCOSE 244* 201* 242*  BUN 24* 25* 21*  CREATININE 1.07* 0.99 1.00  CALCIUM 8.8* 8.8* 8.9   Liver Function Tests: No results for input(s): AST, ALT, ALKPHOS, BILITOT, PROT, ALBUMIN in the last 168 hours. No results for input(s): LIPASE, AMYLASE in the last 168 hours. No results for input(s): AMMONIA in the last 168 hours. CBC:  Recent Labs Lab 08/21/16 2358 08/22/16 0409 08/23/16 1027 08/24/16 0320  WBC 19.3* 17.9* 13.4* 18.8*  NEUTROABS 16.9*  --  9.5* 14.1*  HGB 12.8 12.7 13.3 15.5*  HCT 39.3 38.6 40.5 45.6  MCV 89.1 89.6 89.4 91.2  PLT 250 256 283 278   Cardiac Enzymes:    Recent Labs Lab 08/22/16 1814 08/22/16 2243 08/23/16 0515  TROPONINI 0.09* 0.06* <0.03   BNP (last 3 results)  Recent Labs  08/21/16 2358  BNP 419.0*    ProBNP (last 3 results) No results for input(s): PROBNP in the last 8760 hours.  CBG:  Recent Labs Lab 08/23/16 1728 08/23/16 2104 08/24/16 0850 08/24/16 1241 08/24/16 1649  GLUCAP 206* 198* 248* 207* 265*    Recent Results (from the past 240 hour(s))  Urine Culture     Status: None   Collection Time: 08/21/16 11:15 PM  Result Value Ref Range Status   Specimen Description URINE, RANDOM  Final   Special Requests NONE  Final   Culture NO GROWTH  Final   Report Status 08/23/2016 FINAL  Final  Culture, blood (Routine X 2) w Reflex to ID Panel     Status: None (Preliminary result)   Collection Time: 08/21/16 11:58 PM  Result Value Ref Range Status   Specimen Description BLOOD RIGHT HAND  Final   Special Requests   Final    BOTTLES DRAWN AEROBIC AND ANAEROBIC Blood Culture adequate volume   Culture NO GROWTH 2 DAYS  Final   Report Status PENDING  Incomplete  Culture, blood (Routine X 2) w Reflex to ID Panel     Status: None (Preliminary result)    Collection Time: 08/22/16 12:08 AM  Result Value Ref Range Status   Specimen Description BLOOD LEFT HAND  Final   Special Requests   Final    BOTTLES DRAWN AEROBIC AND ANAEROBIC Blood Culture adequate volume   Culture NO GROWTH 2 DAYS  Final   Report Status PENDING  Incomplete     Studies: Ct Pelvis W Contrast  Result Date: 08/24/2016 CLINICAL DATA:  Pt c/o "deep" pelvic pain, worse on LEFT side. Pt is having difficulty urinating, but is unsure if that is because of the pain when sitting down, or inability to urinate. Hx of posterior pelvic infection. EXAM: CT PELVIS WITH CONTRAST TECHNIQUE: Multidetector CT imaging of the pelvis was performed using the standard protocol following the bolus administration  of intravenous contrast. CONTRAST:  75 ml ISOVUE-300 IOPAMIDOL (ISOVUE-300) INJECTION 61% COMPARISON:  08/20/2016 FINDINGS: Urinary Tract: Visualized distal ureters are nondistended. Urinary bladder physiologically distended. Bowel: Visualized portions of small bowel and colon are nondilated, unremarkable. Vascular/Lymphatic: Scattered iliofemoral arterial calcifications without aneurysm. Prominent bilateral inguinal lymph nodes as before, presumably reactive. No pelvic adenopathy. Reproductive: 3.9 cm right ovarian cyst. Uterus and left adnexal region unremarkable. Other:  No ascites.  No free air. Musculoskeletal: Progressive extensive inflammatory/ edematous changes and scattered subcutaneous fluid centered at the level of the left labia, extending anteriorly through the mons into the anterior pelvic body wall, and posteriorly into the left perineal region. No discrete abscess. 4.5 x 1.3 cm low attenuation collection in the region of the labia minora on the left, previously 4.4 x 1.8 cm. Ill-defined low attenuation collection previously noted at the level of the vaginal introitus on the left is less conspicuous. Facet DJD in the lower lumbar spine. No fracture or worrisome bone lesion. IMPRESSION: 1.  Worsening changes of cellulitis centered at the level of the left labia, without discrete or drainable abscess collection. 2. Stable cystic lesion in the region of the left labia minora. 3. Stable benign-appearing right ovarian cyst. Recommend follow-up pelvic ultrasound in 6-12 months. This recommendation follows ACR consensus guidelines: White Paper of the ACR Incidental Findings Committee II on Adnexal Findings. J Am Coll Radiol [phone]. Electronically Signed   By: Corlis Leak M.D.   On: 08/24/2016 10:19    Scheduled Meds: . clotrimazole   Topical BID  . famotidine  20 mg Oral BID  . ferrous sulfate  325 mg Oral Q breakfast  . insulin aspart  0-5 Units Subcutaneous QHS  . insulin aspart  0-9 Units Subcutaneous TID WC  . losartan  25 mg Oral Daily  . nicotine  21 mg Transdermal Daily  . pregabalin  100 mg Oral BID  . senna-docusate  1 tablet Oral QHS  . traZODone  150 mg Oral QHS    Continuous Infusions: . heparin 2,000 Units/hr (08/24/16 1509)  . piperacillin-tazobactam (ZOSYN)  IV Stopped (08/24/16 1803)  . vancomycin Stopped (08/24/16 0956)     Time spent: 35 mins I have personally reviewed and interpreted daily labs, ct pel imaging personally reviewed and interpreted as discussed above under date review session and assessment and plans.   Jelani Vreeland MD, PhD  [facility] Pager [phone]. If 7PM-7AM, please contact night-coverage at [email or web address], password [facility] 08/24/2016, 7:46 PM  LOS: 3 days

## 2016-08-24 NOTE — Progress Notes (Signed)
Physical Therapy Treatment Patient Details Name: Hannah [name] MRN: [id number] DOB: 1960/05/18 Today's Date: 08/24/2016    History of Present Illness Pt is a 56 y/o female admitted secondary to abscess and cellulitis in perineal and L axillar areas. PMH including but not limited to HTN, depression, DM, CHF and CKD.    PT Comments    Pt able to improve activity tolerance this session and gait 150' with RW.  Pt continues to require assist with bed mobility due to pain.   Follow Up Recommendations  No PT follow up     Equipment Recommendations  Rolling walker with 5" wheels    Recommendations for Other Services       Precautions / Restrictions Restrictions Weight Bearing Restrictions: No    Mobility  Bed Mobility         Supine to sit: Min assist;HOB elevated Sit to supine: Min guard   General bed mobility comments: increased time and effort, use of bed rails, physical assist to elevate trunk  Transfers   Equipment used: Rolling walker (2 wheeled)   Sit to Stand: Supervision         General transfer comment: increased time, supervision for safety  Ambulation/Gait Ambulation/Gait assistance: Min guard Ambulation Distance (Feet): 150 Feet Assistive device: Rolling walker (2 wheeled)       General Gait Details: wide BOS due to pain, pt with little use of UEs on RW, used more for steadying   Stairs            Wheelchair Mobility    Modified Rankin (Stroke Patients Only)       Balance                                            Cognition Arousal/Alertness: Awake/alert Behavior During Therapy: WFL for tasks assessed/performed Overall Cognitive Status: Within Functional Limits for tasks assessed                                        Exercises      General Comments        Pertinent Vitals/Pain Faces Pain Scale: Hurts even more Pain Location: L groin Pain Descriptors / Indicators:  Sore;Grimacing;Guarding Pain Intervention(s): Repositioned;Monitored during session    Home Living                      Prior Function            PT Goals (current goals can now be found in the care plan section) Acute Rehab PT Goals Time For Goal Achievement: 09/06/16 Progress towards PT goals: Progressing toward goals    Frequency    Min 3X/week      PT Plan Current plan remains appropriate    Co-evaluation              AM-PAC PT "6 Clicks" Daily Activity  Outcome Measure  Difficulty turning over in bed (including adjusting bedclothes, sheets and blankets)?: None Difficulty moving from lying on back to sitting on the side of the bed? : A Little Difficulty sitting down on and standing up from a chair with arms (e.g., wheelchair, bedside commode, etc,.)?: A Little Help needed moving to and from a bed to chair (including a wheelchair)?: A Little Help needed walking  in hospital room?: A Little Help needed climbing 3-5 steps with a railing? : A Little 6 Click Score: 19    End of Session Equipment Utilized During Treatment: Gait belt Activity Tolerance: Patient tolerated treatment well Patient left: in bed;with call bell/phone within reach   PT Visit Diagnosis: Other abnormalities of gait and mobility (R26.89);Pain Pain - part of body:  (groin)     Time: [id number] PT Time Calculation (min) (ACUTE ONLY): 24 min  Charges:  $Gait Training: 8-22 mins $Therapeutic Activity: 8-22 mins                    G Codes:       Hannah EyeKaren Kristin [name], PT, DPT   Bartosz Luginbill 08/24/2016, 12:10 PM

## 2016-08-25 LAB — CBC
HCT: 37.4 % (ref 36.0–46.0)
HEMOGLOBIN: 12.4 g/dL (ref 12.0–15.0)
MCH: 29.8 pg (ref 26.0–34.0)
MCHC: 33.2 g/dL (ref 30.0–36.0)
MCV: 89.9 fL (ref 78.0–100.0)
PLATELETS: 278 10*3/uL (ref 150–400)
RBC: 4.16 MIL/uL (ref 3.87–5.11)
RDW: 14.3 % (ref 11.5–15.5)
WBC: 17.5 10*3/uL — AB (ref 4.0–10.5)

## 2016-08-25 LAB — BASIC METABOLIC PANEL
ANION GAP: 6 (ref 5–15)
BUN: 14 mg/dL (ref 6–20)
CO2: 25 mmol/L (ref 22–32)
Calcium: 8.2 mg/dL — ABNORMAL LOW (ref 8.9–10.3)
Chloride: 105 mmol/L (ref 101–111)
Creatinine, Ser: 0.82 mg/dL (ref 0.44–1.00)
GFR calc non Af Amer: >60 mL/min (ref >60)
Glucose, Bld: 279 mg/dL — ABNORMAL HIGH (ref 65–99)
POTASSIUM: 3.2 mmol/L — AB (ref 3.5–5.1)
Sodium: 136 mmol/L (ref 135–145)

## 2016-08-25 LAB — HEPARIN LEVEL (UNFRACTIONATED): HEPARIN UNFRACTIONATED: 0.6 [IU]/mL (ref 0.30–0.70)

## 2016-08-25 LAB — GLUCOSE, CAPILLARY
GLUCOSE-CAPILLARY: 263 mg/dL — AB (ref 65–99)
GLUCOSE-CAPILLARY: 297 mg/dL — AB (ref 65–99)
GLUCOSE-CAPILLARY: 219 mg/dL — AB (ref 65–99)
Glucose-Capillary: 262 mg/dL — ABNORMAL HIGH (ref 65–99)

## 2016-08-25 MED ORDER — POLYETHYLENE GLYCOL 3350 17 G PO PACK
17.0000 g | PACK | Freq: Every day | ORAL | Status: DC
  Administered 2016-08-25 – 2016-08-30 (×2): 17 g via ORAL
  Filled 2016-08-25 (×6): qty 1

## 2016-08-25 MED ORDER — HEPARIN BOLUS VIA INFUSION
3000.0000 [IU] | Freq: Once | INTRAVENOUS | Status: AC
  Administered 2016-08-25: 3000 [IU] via INTRAVENOUS
  Filled 2016-08-25: qty 3000

## 2016-08-25 MED ORDER — SENNOSIDES-DOCUSATE SODIUM 8.6-50 MG PO TABS
1.0000 | ORAL_TABLET | Freq: Two times a day (BID) | ORAL | Status: DC
  Administered 2016-08-25 – 2016-09-02 (×14): 1 via ORAL
  Filled 2016-08-25 (×17): qty 1

## 2016-08-25 MED ORDER — INSULIN GLARGINE 100 UNIT/ML ~~LOC~~ SOLN
10.0000 [IU] | Freq: Every day | SUBCUTANEOUS | Status: DC
  Administered 2016-08-26: 10 [IU] via SUBCUTANEOUS
  Filled 2016-08-25 (×3): qty 0.1

## 2016-08-25 MED ORDER — POTASSIUM CHLORIDE CRYS ER 20 MEQ PO TBCR
40.0000 meq | EXTENDED_RELEASE_TABLET | Freq: Once | ORAL | Status: AC
  Administered 2016-08-25: 40 meq via ORAL
  Filled 2016-08-25: qty 2

## 2016-08-25 NOTE — Progress Notes (Signed)
ANTICOAGULATION CONSULT NOTE - Follow Up Consult  Pharmacy Consult for heparin Indication: h/o DVT  Labs:  Recent Labs  08/22/16 1814 08/22/16 2243 08/23/16 0515  08/23/16 1027  08/24/16 0320 08/24/16 1049 08/25/16 0409 08/25/16 1258  HGB  --   --   --   < > 13.3  --  15.5*  --  12.4  --   HCT  --   --   --   --  40.5  --  45.6  --  37.4  --   PLT  --   --   --   --  283  --  278  --  278  --   LABPROT  --   --  22.7*  --   --   --   --   --   --   --   INR  --   --  1.97  --   --   --   --   --   --   --   HEPARINUNFRC  --   --   --   --   --   < > <0.10* 0.34 <0.10* 0.60  CREATININE  --   --   --   --   --   --  1.00  --  0.82  --   TROPONINI 0.09* 0.06* <0.03  --   --   --   --   --   --   --   < > = values in this interval not displayed.  Assessment: 56yo female at goal heparin level of 0.6 on 2400 units/hr.  Goal of Therapy:  No dose adjustment needed at this time. Continue heparin at 2400 units/hr. Heparin level 0.3-0.7 units/ml Monitor platelets by anticoagulation protocol: Yes  Plan:  Continue heparin 2400 units/hr F/U AM labs F/U plans to restart coumadin  Ladell PierBrooke Darby Shadwick, PharmD Pharmacy Resident Pager: [phone] 08/25/2016 2:16 PM

## 2016-08-25 NOTE — Progress Notes (Signed)
PROGRESS NOTE  Rosabell Day Natter RUE:[id number] DOB: 07/12/1960 DOA: 08/21/2016 PCP: Anselmo Pickler, MD  HPI/Recap of past 24 hours:  remain in pain,  She reports pain is not tolerable with any movement, no drainage, no fever   Assessment/Plan: Principal Problem:   Labial abscess Active Problems:   Iron deficiency anemia   Essential hypertension   DVT (deep venous thrombosis) (HCC)   Diabetes mellitus with renal complications (HCC)   Cellulitis, perineum   Abscess of left axilla   Cellulitis of axilla, left   Chronic diastolic CHF (congestive heart failure) (HCC)   Supratherapeutic INR   CKD (chronic kidney disease), stage III    Labial abscess and perineal cellulitis, and abscess/cellulitis of left axillary area:  -Currently patient is nonseptic. Lactic acid is normal. Hemodynamically stable.  -- - Blood cultures x 2 no growth - wound care consult -OB/GYN input appreciated, continue iv abx for now, repeat ct pel with contrast on 8/10 remain significant inflammation/infection, but no drainable collection.  -labs showed persistent leukocytosis - continue vancomycin and Zosyn per pharmacy, no significant improvement yet  Hypokalemia: replace k, mag  Diabetes mellitus with renal complications: noninsulin dependent -Last A1c is 11.9. Patient is taking Amaryl at home --diabetes education, I have discussed with  patient about diabetes disease management and  Plan to start her on  Insulin, she reports currently does not have insurance, case manager consulted.  -blood sugar elevated, start long acting insulin, continue ssi, continue adjust insulin  Hx of DVT, recurent: on coumadin with supratherapeutic.  --hold coumadin now, heparin drip started on 8/9 once inr down  Chronic diastolic CHF (congestive heart failure) (HCC): Patient does not have leg edema JVD. CHF is compensated. 2-D echo on 08/21/16 showed EF 60-65%. In [facility], after patient received 3rd units of  FFP and one dose of Lasix, patient developed diffuse itchy and urticarial rash in her trunk, bilateral thighs. Not sure if pt is allergic to FFP or lasix. It may better to avoid using lasix in future. -BNP 419 -volume status stable so far, good urine output,   Sudden onset of substernal chest pain/epigastric pain after being repositioned in bed on 8/9, vital stable, no hypoxia, on exam with mild epigastric tenderness, no guarding, no rebound, STAT EKG no acute interval changes compare to the on obtained on admission ( personally reviewed) cycle troponin flat  -echocardiogram from outside hospital unremarkable -Improved on pepcid, continue  Hypertension: -IV hydralazine when necessary -Continue Cozaar,   CKD (chronic kidney disease), stage III: Stable. Creatinine is 1.07 on admission. To follow up renal function by BMP, renal dosing meds  Iron deficiency anemia:  -Continue iron supplement  Tobacco abuse: -Did counseling about importance of quitting smoking -Nicotine patch   Body mass index is 38.47 kg/m.  DVT ppx: admitted with supratherapeutic INR,  heparin drip started on 8/9 once inr trending down Code Status: Full code Family Communication: patient  Disposition Plan:  not ready for discharge  Consultants:  Gyn   Procedures:  none  Antibiotics:  vanc/zosyn   Objective: BP 139/76 (BP Location: Right Arm)   Pulse 82   Temp 98.1 F (36.7 C) (Oral)   Resp 18   Ht 5\' 8"  (1.727 m)   Wt 114.8 kg (253 lb)   SpO2 100%   BMI 38.47 kg/m   Intake/Output Summary (Last 24 hours) at 08/25/16 0858 Last data filed at 08/25/16 0700  Gross per 24 hour  Intake  1049.33 ml  Output             2400 ml  Net         -1350.67 ml   Filed Weights   08/23/16 2144 08/24/16 0457 08/25/16 0534  Weight: 113.9 kg (251 lb) 114.4 kg (252 lb 3.2 oz) 114.8 kg (253 lb)    Exam: Patient is examined daily including today on 08/25/2016, exam remain the same as of yesterday  except that is changed   General:  Obese, NAD, reluctant to move due to pain  Cardiovascular: RRR  Respiratory: CTABL  Abdomen: Soft/ND/NT, positive BS, induration, tender more on the left side, warm to touch mons, left inguinal area, left labial   Musculoskeletal: No Edema  Neuro: aaox3  Data Reviewed: Basic Metabolic Panel:  Recent Labs Lab 08/21/16 2358 08/22/16 0409 08/24/16 0320 08/25/16 0409  NA 137 137 137 136  K 3.8 3.8 4.2 3.2*  CL 105 107 103 105  CO2 20* 21* 21* 25  GLUCOSE 244* 201* 242* 279*  BUN 24* 25* 21* 14  CREATININE 1.07* 0.99 1.00 0.82  CALCIUM 8.8* 8.8* 8.9 8.2*   Liver Function Tests: No results for input(s): AST, ALT, ALKPHOS, BILITOT, PROT, ALBUMIN in the last 168 hours. No results for input(s): LIPASE, AMYLASE in the last 168 hours. No results for input(s): AMMONIA in the last 168 hours. CBC:  Recent Labs Lab 08/21/16 2358 08/22/16 0409 08/23/16 1027 08/24/16 0320 08/25/16 0409  WBC 19.3* 17.9* 13.4* 18.8* 17.5*  NEUTROABS 16.9*  --  9.5* 14.1*  --   HGB 12.8 12.7 13.3 15.5* 12.4  HCT 39.3 38.6 40.5 45.6 37.4  MCV 89.1 89.6 89.4 91.2 89.9  PLT 250 256 283 278 278   Cardiac Enzymes:    Recent Labs Lab 08/22/16 1814 08/22/16 2243 08/23/16 0515  TROPONINI 0.09* 0.06* <0.03   BNP (last 3 results)  Recent Labs  08/21/16 2358  BNP 419.0*    ProBNP (last 3 results) No results for input(s): PROBNP in the last 8760 hours.  CBG:  Recent Labs Lab 08/24/16 0850 08/24/16 1241 08/24/16 1649 08/24/16 2320 08/25/16 0757  GLUCAP 248* 207* 265* 233* 263*    Recent Results (from the past 240 hour(s))  Urine Culture     Status: None   Collection Time: 08/21/16 11:15 PM  Result Value Ref Range Status   Specimen Description URINE, RANDOM  Final   Special Requests NONE  Final   Culture NO GROWTH  Final   Report Status 08/23/2016 FINAL  Final  Culture, blood (Routine X 2) w Reflex to ID Panel     Status: None (Preliminary  result)   Collection Time: 08/21/16 11:58 PM  Result Value Ref Range Status   Specimen Description BLOOD RIGHT HAND  Final   Special Requests   Final    BOTTLES DRAWN AEROBIC AND ANAEROBIC Blood Culture adequate volume   Culture NO GROWTH 2 DAYS  Final   Report Status PENDING  Incomplete  Culture, blood (Routine X 2) w Reflex to ID Panel     Status: None (Preliminary result)   Collection Time: 08/22/16 12:08 AM  Result Value Ref Range Status   Specimen Description BLOOD LEFT HAND  Final   Special Requests   Final    BOTTLES DRAWN AEROBIC AND ANAEROBIC Blood Culture adequate volume   Culture NO GROWTH 2 DAYS  Final   Report Status PENDING  Incomplete     Studies: No results found.  Scheduled Meds: .  clotrimazole   Topical BID  . famotidine  20 mg Oral BID  . ferrous sulfate  325 mg Oral Q breakfast  . insulin aspart  0-5 Units Subcutaneous QHS  . insulin aspart  0-9 Units Subcutaneous TID WC  . insulin glargine  10 Units Subcutaneous QHS  . losartan  25 mg Oral Daily  . nicotine  21 mg Transdermal Daily  . potassium chloride  40 mEq Oral Once  . pregabalin  100 mg Oral BID  . senna-docusate  1 tablet Oral QHS  . traZODone  150 mg Oral QHS    Continuous Infusions: . heparin 2,400 Units/hr (08/25/16 0737)  . piperacillin-tazobactam (ZOSYN)  IV 3.375 g (08/25/16 0545)  . vancomycin Stopped (08/24/16 2258)      Emmersen Garraway MD, PhD  [facility] Pager [phone]. If 7PM-7AM, please contact night-coverage at [email or web address], password [email or web address] 08/25/2016, 8:58 AM  LOS: 4 days

## 2016-08-25 NOTE — Progress Notes (Signed)
ANTICOAGULATION CONSULT NOTE - Follow Up Consult  Pharmacy Consult for heparin Indication: h/o DVT  Labs:  Recent Labs  08/22/16 0922 08/22/16 1814 08/22/16 2243 08/23/16 0515  08/23/16 1027  08/24/16 0320 08/24/16 1049 08/25/16 0409  HGB  --   --   --   --   < > 13.3  --  15.5*  --  12.4  HCT  --   --   --   --   --  40.5  --  45.6  --  37.4  PLT  --   --   --   --   --  283  --  278  --  278  LABPROT 26.4*  --   --  22.7*  --   --   --   --   --   --   INR 2.38  --   --  1.97  --   --   --   --   --   --   HEPARINUNFRC  --   --   --   --   --   --   < > <0.10* 0.34 <0.10*  CREATININE  --   --   --   --   --   --   --  1.00  --  0.82  TROPONINI  --  0.09* 0.06* <0.03  --   --   --   --   --   --   < > = values in this interval not displayed.  Assessment: 56yo female now undetectable on heparin after one level at goal; no gtt issues per RN.  Goal of Therapy:  Heparin level 0.3-0.7 units/ml   Plan:  Will give heparin bolus of 3000 units and increase gtt by 4 units/kg/hr to 2400 units/hr and check level in 6hr.  Vernard GamblesVeronda Natalie Leclaire, PharmD, BCPS  08/25/2016,7:02 AM

## 2016-08-26 LAB — BASIC METABOLIC PANEL
Anion gap: 9 (ref 5–15)
BUN: 9 mg/dL (ref 6–20)
CALCIUM: 8.2 mg/dL — AB (ref 8.9–10.3)
CO2: 25 mmol/L (ref 22–32)
CREATININE: 0.87 mg/dL (ref 0.44–1.00)
Chloride: 103 mmol/L (ref 101–111)
GFR calc Af Amer: >60 mL/min (ref >60)
GFR calc non Af Amer: >60 mL/min (ref >60)
GLUCOSE: 199 mg/dL — AB (ref 65–99)
Potassium: 3.4 mmol/L — ABNORMAL LOW (ref 3.5–5.1)
Sodium: 137 mmol/L (ref 135–145)

## 2016-08-26 LAB — GLUCOSE, CAPILLARY
GLUCOSE-CAPILLARY: 214 mg/dL — AB (ref 65–99)
GLUCOSE-CAPILLARY: 210 mg/dL — AB (ref 65–99)
GLUCOSE-CAPILLARY: 169 mg/dL — AB (ref 65–99)
GLUCOSE-CAPILLARY: 244 mg/dL — AB (ref 65–99)
Glucose-Capillary: 239 mg/dL — ABNORMAL HIGH (ref 65–99)

## 2016-08-26 LAB — HEPATIC FUNCTION PANEL
ALK PHOS: 73 U/L (ref 38–126)
ALT: 17 U/L (ref 14–54)
AST: 13 U/L — ABNORMAL LOW (ref 15–41)
Albumin: 1.8 g/dL — ABNORMAL LOW (ref 3.5–5.0)
BILIRUBIN DIRECT: 0.1 mg/dL (ref 0.1–0.5)
BILIRUBIN INDIRECT: 0.7 mg/dL (ref 0.3–0.9)
TOTAL PROTEIN: 6.2 g/dL — AB (ref 6.5–8.1)
Total Bilirubin: 0.8 mg/dL (ref 0.3–1.2)

## 2016-08-26 LAB — CBC
HCT: 37.3 % (ref 36.0–46.0)
Hemoglobin: 12.5 g/dL (ref 12.0–15.0)
MCH: 29.6 pg (ref 26.0–34.0)
MCHC: 33.5 g/dL (ref 30.0–36.0)
MCV: 88.4 fL (ref 78.0–100.0)
PLATELETS: 311 10*3/uL (ref 150–400)
RBC: 4.22 MIL/uL (ref 3.87–5.11)
RDW: 13.9 % (ref 11.5–15.5)
WBC: 18.8 10*3/uL — ABNORMAL HIGH (ref 4.0–10.5)

## 2016-08-26 LAB — MAGNESIUM: Magnesium: 1.7 mg/dL (ref 1.7–2.4)

## 2016-08-26 LAB — HEPARIN LEVEL (UNFRACTIONATED): HEPARIN UNFRACTIONATED: 0.48 [IU]/mL (ref 0.30–0.70)

## 2016-08-26 LAB — MRSA PCR SCREENING: MRSA BY PCR: NEGATIVE

## 2016-08-26 MED ORDER — INSULIN GLARGINE 100 UNIT/ML ~~LOC~~ SOLN
15.0000 [IU] | Freq: Every day | SUBCUTANEOUS | Status: DC
  Administered 2016-08-26: 15 [IU] via SUBCUTANEOUS
  Filled 2016-08-26: qty 0.15

## 2016-08-26 MED ORDER — HYDROMORPHONE HCL 1 MG/ML IJ SOLN
0.5000 mg | INTRAMUSCULAR | Status: DC | PRN
  Administered 2016-08-26 – 2016-08-27 (×4): 0.5 mg via INTRAVENOUS
  Filled 2016-08-26 (×4): qty 1

## 2016-08-26 MED ORDER — POTASSIUM CHLORIDE CRYS ER 20 MEQ PO TBCR
40.0000 meq | EXTENDED_RELEASE_TABLET | Freq: Once | ORAL | Status: AC
  Administered 2016-08-26: 40 meq via ORAL
  Filled 2016-08-26: qty 2

## 2016-08-26 NOTE — Progress Notes (Signed)
ANTICOAGULATION CONSULT NOTE - Follow Up Consult  Pharmacy Consult for heparin Indication: h/o DVT  Labs:  Recent Labs  08/24/16 0320  08/25/16 0409 08/25/16 1258 08/26/16 0432  HGB 15.5*  --  12.4  --  12.5  HCT 45.6  --  37.4  --  37.3  PLT 278  --  278  --  311  HEPARINUNFRC <0.10*  < > <0.10* 0.60 0.48  CREATININE 1.00  --  0.82  --  0.87  < > = values in this interval not displayed.  Assessment: 56yo female within goal heparin level of 0.48 on 2400 units/hr.  Heparin level continues to be therapeutic (0.48) on 2400 units/hr. Hgb 12.5, stable, pltc wnl, no bleeding noted per chart, neuro stable.    Goal of Therapy:  No dose adjustment needed at this time. Continue heparin at 2400 units/hr. Heparin level 0.3-0.7 units/ml Monitor platelets by anticoagulation protocol: Yes  Plan:  Continue heparin 2400 units/hr F/U AM labs F/U plans to restart coumadin  Ladell PierBrooke Margrit Minner, PharmD Pharmacy Resident Pager: [phone] 08/26/2016 10:50 AM

## 2016-08-26 NOTE — Progress Notes (Signed)
PROGRESS NOTE  Hannah [name] ZOX:[id number] DOB: 09-Jan-1961 DOA: 08/21/2016 PCP: Anselmo PicklerAchreja, Manjeet Kaur, MD  HPI/Recap of past 24 hours:  remain in pain,  Continue Requiring iv dilaudid and oral percocet frequently, she remain on iv zosyn/vanc and heparin drip She remain has poor oral intake, does not eat much  no drainage, no fever   Assessment/Plan: Principal Problem:   Labial abscess Active Problems:   Iron deficiency anemia   Essential hypertension   DVT (deep venous thrombosis) (HCC)   Diabetes mellitus with renal complications (HCC)   Cellulitis, perineum   Abscess of left axilla   Cellulitis of axilla, left   Chronic diastolic CHF (congestive heart failure) (HCC)   Supratherapeutic INR   CKD (chronic kidney disease), stage III    Labial abscess and perineal cellulitis -- Blood cultures x 2 no growth, mrsa screening negative -  repeat ct pel with contrast on 8/10 remain significant inflammation/infection, but no drainable collection.   -continue vancomycin and Zosyn per pharmacy, no significant improvement yet-labs showed persistent leukocytosis, she continue to require iv and oral narcotic for pain control,  -OB/GYN input appreciated  Abscess/cellulitis of left axillary area: Left axilla abscess spontaneously ruptured,  Wound care consulted, input appreciated  Hypokalemia:  mag 1.7 k improved to 3.4, continue to replace, recheck in am  Diabetes mellitus with renal complications: previously not on insulin -Last A1c is 11.9. Patient is taking Amaryl at home --diabetes education, I have discussed with  patient about diabetes disease management and  Plan to start her on  Insulin, she reports currently does not have insurance, case manager consulted.  -blood sugar remain elevated, increase long acting insulin, continue ssi, continue adjust insulin  Hx of DVT, recurent: on coumadin with supratherapeutic.  --hold coumadin now, heparin drip started on 8/9 once inr  down, continue on heparin drip in case of needing surgical intervention   Chronic diastolic CHF (congestive heart failure) (HCC): Patient does not have leg edema JVD. CHF is compensated. 2-D echo on 08/21/16 showed EF 60-65%. [facility] 3rd units of FFP and one dose of Lasix, patient developed diffuse itchy and urticarial rash in her trunk, bilateral thighs. Not sure if pt is allergic to FFP or lasix. It may better to avoid using lasix in future. -BNP 419 -volume status stable so far, good urine output,   Sudden onset of substernal chest pain/epigastric pain after being repositioned in bed on 8/9, vital stable, no hypoxia, on exam with mild epigastric tenderness, no guarding, no rebound, STAT EKG no acute interval changes compare to the on obtained on admission ( personally reviewed) cycle troponin flat  -echocardiogram from outside hospital unremarkable -Improved on pepcid, continue  Hypertension: -IV hydralazine when necessary -Continue Cozaar,   CKD (chronic kidney disease), stage III: Stable. Creatinine is 1.07 on admission. To follow up renal function by BMP, renal dosing meds  Iron deficiency anemia:  -Continue iron supplement  Tobacco abuse: -Did counseling about importance of quitting smoking -Nicotine patch  Obesity: Body mass index is 38.47 kg/m.  DVT ppx: admitted with supratherapeutic INR,  heparin drip started on 8/9 once inr trending down Code Status: Full code Family Communication: patient  Disposition Plan:  not ready for discharge  Consultants:  Gyn   Procedures:  none  Antibiotics:  vanc/zosyn   Objective: BP (!) 148/72 (BP Location: Right Arm)   Pulse 84   Temp 97.9 F (36.6 C) (Oral)   Resp 18   Ht 5'  8" (1.727 m)   Wt 114.8 kg (253 lb)   SpO2 98%   BMI 38.47 kg/m   Intake/Output Summary (Last 24 hours) at 08/26/16 1610 Last data filed at 08/25/16 1811  Gross per 24 hour  Intake           463.53 ml   Output             2101 ml  Net         -1637.47 ml   Filed Weights   08/23/16 2144 08/24/16 0457 08/25/16 0534  Weight: 113.9 kg (251 lb) 114.4 kg (252 lb 3.2 oz) 114.8 kg (253 lb)    Exam: Patient is examined daily including today on 08/26/2016, exam remain the same as of yesterday except that is changed   General:  Obese, NAD, remain in pain  Cardiovascular: RRR  Respiratory: CTABL  Abdomen: Soft/ND/NT, positive BS, induration, tender more on the left side, warm to touch mons, left inguinal area, left labial   Musculoskeletal: No Edema  Neuro: aaox3  Data Reviewed: Basic Metabolic Panel:  Recent Labs Lab 08/21/16 2358 08/22/16 0409 08/24/16 0320 08/25/16 0409 08/26/16 0432  NA 137 137 137 136 137  K 3.8 3.8 4.2 3.2* 3.4*  CL 105 107 103 105 103  CO2 20* 21* 21* 25 25  GLUCOSE 244* 201* 242* 279* 199*  BUN 24* 25* 21* 14 9  CREATININE 1.07* 0.99 1.00 0.82 0.87  CALCIUM 8.8* 8.8* 8.9 8.2* 8.2*  MG  --   --   --   --  1.7   Liver Function Tests: No results for input(s): AST, ALT, ALKPHOS, BILITOT, PROT, ALBUMIN in the last 168 hours. No results for input(s): LIPASE, AMYLASE in the last 168 hours. No results for input(s): AMMONIA in the last 168 hours. CBC:  Recent Labs Lab 08/21/16 2358 08/22/16 0409 08/23/16 1027 08/24/16 0320 08/25/16 0409 08/26/16 0432  WBC 19.3* 17.9* 13.4* 18.8* 17.5* 18.8*  NEUTROABS 16.9*  --  9.5* 14.1*  --   --   HGB 12.8 12.7 13.3 15.5* 12.4 12.5  HCT 39.3 38.6 40.5 45.6 37.4 37.3  MCV 89.1 89.6 89.4 91.2 89.9 88.4  PLT 250 256 283 278 278 311   Cardiac Enzymes:    Recent Labs Lab 08/22/16 1814 08/22/16 2243 08/23/16 0515  TROPONINI 0.09* 0.06* <0.03   BNP (last 3 results)  Recent Labs  08/21/16 2358  BNP 419.0*    ProBNP (last 3 results) No results for input(s): PROBNP in the last 8760 hours.  CBG:  Recent Labs Lab 08/25/16 1209 08/25/16 1634 08/25/16 2217 08/26/16 0257 08/26/16 0807  GLUCAP  297* 219* 262* 214* 210*    Recent Results (from the past 240 hour(s))  Urine Culture     Status: None   Collection Time: 08/21/16 11:15 PM  Result Value Ref Range Status   Specimen Description URINE, RANDOM  Final   Special Requests NONE  Final   Culture NO GROWTH  Final   Report Status 08/23/2016 FINAL  Final  Culture, blood (Routine X 2) w Reflex to ID Panel     Status: None (Preliminary result)   Collection Time: 08/21/16 11:58 PM  Result Value Ref Range Status   Specimen Description BLOOD RIGHT HAND  Final   Special Requests   Final    BOTTLES DRAWN AEROBIC AND ANAEROBIC Blood Culture adequate volume   Culture NO GROWTH 3 DAYS  Final   Report Status PENDING  Incomplete  Culture, blood (Routine  X 2) w Reflex to ID Panel     Status: None (Preliminary result)   Collection Time: 08/22/16 12:08 AM  Result Value Ref Range Status   Specimen Description BLOOD LEFT HAND  Final   Special Requests   Final    BOTTLES DRAWN AEROBIC AND ANAEROBIC Blood Culture adequate volume   Culture NO GROWTH 3 DAYS  Final   Report Status PENDING  Incomplete     Studies: No results found.  Scheduled Meds: . clotrimazole   Topical BID  . famotidine  20 mg Oral BID  . ferrous sulfate  325 mg Oral Q breakfast  . insulin aspart  0-5 Units Subcutaneous QHS  . insulin aspart  0-9 Units Subcutaneous TID WC  . insulin glargine  10 Units Subcutaneous QHS  . losartan  25 mg Oral Daily  . nicotine  21 mg Transdermal Daily  . polyethylene glycol  17 g Oral Daily  . pregabalin  100 mg Oral BID  . senna-docusate  1 tablet Oral BID  . traZODone  150 mg Oral QHS    Continuous Infusions: . heparin 2,400 Units/hr (08/26/16 0012)  . piperacillin-tazobactam (ZOSYN)  IV 3.375 g (08/26/16 0559)  . vancomycin Stopped (08/25/16 2118)      Alesi Zachery MD, PhD  [facility] Pager [phone]. If 7PM-7AM, please contact night-coverage at [email or web address], password [facility] 08/26/2016, 8:22 AM  LOS: 5 days

## 2016-08-27 DIAGNOSIS — N8500 Endometrial hyperplasia, unspecified: Secondary | ICD-10-CM

## 2016-08-27 LAB — LACTIC ACID, PLASMA: LACTIC ACID, VENOUS: 1 mmol/L (ref 0.5–1.9)

## 2016-08-27 LAB — GLUCOSE, CAPILLARY
GLUCOSE-CAPILLARY: 249 mg/dL — AB (ref 65–99)
GLUCOSE-CAPILLARY: 220 mg/dL — AB (ref 65–99)
Glucose-Capillary: 251 mg/dL — ABNORMAL HIGH (ref 65–99)
Glucose-Capillary: 191 mg/dL — ABNORMAL HIGH (ref 65–99)

## 2016-08-27 LAB — BASIC METABOLIC PANEL
ANION GAP: 4 — AB (ref 5–15)
BUN: 7 mg/dL (ref 6–20)
CALCIUM: 8.3 mg/dL — AB (ref 8.9–10.3)
CHLORIDE: 103 mmol/L (ref 101–111)
CO2: 30 mmol/L (ref 22–32)
Creatinine, Ser: 0.8 mg/dL (ref 0.44–1.00)
GFR calc non Af Amer: >60 mL/min (ref >60)
Glucose, Bld: 228 mg/dL — ABNORMAL HIGH (ref 65–99)
POTASSIUM: 3.6 mmol/L (ref 3.5–5.1)
Sodium: 137 mmol/L (ref 135–145)

## 2016-08-27 LAB — CULTURE, BLOOD (ROUTINE X 2)
Culture: NO GROWTH
Culture: NO GROWTH
SPECIAL REQUESTS: ADEQUATE
SPECIAL REQUESTS: ADEQUATE

## 2016-08-27 LAB — MAGNESIUM: Magnesium: 1.7 mg/dL (ref 1.7–2.4)

## 2016-08-27 LAB — CBC
HEMATOCRIT: 36.6 % (ref 36.0–46.0)
HEMOGLOBIN: 12.2 g/dL (ref 12.0–15.0)
MCH: 29.7 pg (ref 26.0–34.0)
MCHC: 33.3 g/dL (ref 30.0–36.0)
MCV: 89.1 fL (ref 78.0–100.0)
PLATELETS: 315 10*3/uL (ref 150–400)
RBC: 4.11 MIL/uL (ref 3.87–5.11)
RDW: 14.1 % (ref 11.5–15.5)
WBC: 18.7 10*3/uL — AB (ref 4.0–10.5)

## 2016-08-27 LAB — HEPARIN LEVEL (UNFRACTIONATED): Heparin Unfractionated: 0.4 IU/mL (ref 0.30–0.70)

## 2016-08-27 MED ORDER — HYDROMORPHONE HCL 1 MG/ML IJ SOLN
1.0000 mg | INTRAMUSCULAR | Status: DC | PRN
  Administered 2016-08-29 – 2016-08-31 (×4): 1 mg via INTRAVENOUS
  Filled 2016-08-27 (×6): qty 1

## 2016-08-27 MED ORDER — INSULIN ASPART PROT & ASPART (70-30 MIX) 100 UNIT/ML ~~LOC~~ SUSP
14.0000 [IU] | Freq: Two times a day (BID) | SUBCUTANEOUS | Status: DC
  Administered 2016-08-27 – 2016-09-02 (×11): 14 [IU] via SUBCUTANEOUS
  Filled 2016-08-27: qty 10

## 2016-08-27 MED ORDER — INSULIN GLARGINE 100 UNIT/ML ~~LOC~~ SOLN: 20.0000 [IU] | Freq: Every day | SUBCUTANEOUS | Status: DC

## 2016-08-27 MED ORDER — MAGNESIUM OXIDE 400 (241.3 MG) MG PO TABS
400.0000 mg | ORAL_TABLET | Freq: Every day | ORAL | Status: AC
  Administered 2016-08-27 – 2016-08-29 (×3): 400 mg via ORAL
  Filled 2016-08-27 (×3): qty 1

## 2016-08-27 NOTE — Progress Notes (Signed)
Subjective: Interval History:Patient is now 8 days on IV Abx. She has not noted any significant improvement. There continues to be a lot of pain. Her WBC has not changed. She is afebrile.  Objective: Vital signs in last 24 hours: Temp:  [98.2 F (36.8 C)-98.4 F (36.9 C)] 98.4 F (36.9 C) (08/13 0519) Pulse Rate:  [80-92] 89 (08/13 0519) Resp:  [16-18] 18 (08/13 0519) BP: (124-152)/(68-82) 150/68 (08/13 0519) SpO2:  [98 %-99 %] 99 % (08/13 0519) Weight:  [251 lb 14.4 oz (114.3 kg)] 251 lb 14.4 oz (114.3 kg) (08/13 0519)  Intake/Output from previous day: 08/12 0701 - 08/13 0700 In: 671.2 [I.V.:421.2] Out: -  Intake/Output this shift: Total I/O In: 222 [P.O.:222] Out: -   General appearance: alert, cooperative and appears stated age Lungs: normal effort Abdomen: soft, non-tender; bowel sounds normal; no masses,  no organomegaly Pelvic: marked induration of perineum on left with swollen left labia minora without induration. Mons pubis with large firm collection. Minimal erythema. No fluctuance noted. Extremities: Homans sign is negative, no sign of DVT Skin: healing area covered in bandage in left axilla   Media Information     Document Information   Photos    08/27/2016 12:01  Attached To:  Hospital Encounter on 08/21/16  Source Information   Reva Bores, MD  [facility]     Results for orders placed or performed during the hospital encounter of 08/21/16 (from the past 24 hour(s))  Glucose, capillary     Status: Abnormal   Collection Time: 08/26/16 12:15 PM  Result Value Ref Range   Glucose-Capillary 239 (H) 65 - 99 mg/dL  Glucose, capillary     Status: Abnormal   Collection Time: 08/26/16  5:11 PM  Result Value Ref Range   Glucose-Capillary 169 (H) 65 - 99 mg/dL  Hepatic function panel     Status: Abnormal   Collection Time: 08/26/16  6:20 PM  Result Value Ref Range   Total Protein 6.2 (L) 6.5 - 8.1 g/dL   Albumin 1.8 (L) 3.5 - 5.0 g/dL   AST 13 (L)  15 - 41 U/L   ALT 17 14 - 54 U/L   Alkaline Phosphatase 73 38 - 126 U/L   Total Bilirubin 0.8 0.3 - 1.2 mg/dL   Bilirubin, Direct 0.1 0.1 - 0.5 mg/dL   Indirect Bilirubin 0.7 0.3 - 0.9 mg/dL  Glucose, capillary     Status: Abnormal   Collection Time: 08/26/16 10:27 PM  Result Value Ref Range   Glucose-Capillary 244 (H) 65 - 99 mg/dL  CBC     Status: Abnormal   Collection Time: 08/27/16  2:31 AM  Result Value Ref Range   WBC 18.7 (H) 4.0 - 10.5 K/uL   RBC 4.11 3.87 - 5.11 MIL/uL   Hemoglobin 12.2 12.0 - 15.0 g/dL   HCT 16.1 09.6 - 04.5 %   MCV 89.1 78.0 - 100.0 fL   MCH 29.7 26.0 - 34.0 pg   MCHC 33.3 30.0 - 36.0 g/dL   RDW 40.9 81.1 - 91.4 %   Platelets 315 150 - 400 K/uL  Heparin level (unfractionated)     Status: None   Collection Time: 08/27/16  2:31 AM  Result Value Ref Range   Heparin Unfractionated 0.40 0.30 - 0.70 IU/mL  Basic metabolic panel     Status: Abnormal   Collection Time: 08/27/16  2:31 AM  Result Value Ref Range   Sodium 137 135 - 145 mmol/L   Potassium 3.6  3.5 - 5.1 mmol/L   Chloride 103 101 - 111 mmol/L   CO2 30 22 - 32 mmol/L   Glucose, Bld 228 (H) 65 - 99 mg/dL   BUN 7 6 - 20 mg/dL   Creatinine, Ser 1.470.80 0.44 - 1.00 mg/dL   Calcium 8.3 (L) 8.9 - 10.3 mg/dL   GFR calc non Af Amer >60 >60 mL/min   GFR calc Af Amer >60 >60 mL/min   Anion gap 4 (L) 5 - 15  Magnesium     Status: None   Collection Time: 08/27/16  2:31 AM  Result Value Ref Range   Magnesium 1.7 1.7 - 2.4 mg/dL  Lactic acid, plasma     Status: None   Collection Time: 08/27/16  2:31 AM  Result Value Ref Range   Lactic Acid, Venous 1.0 0.5 - 1.9 mmol/L  Glucose, capillary     Status: Abnormal   Collection Time: 08/27/16  7:53 AM  Result Value Ref Range   Glucose-Capillary 249 (H) 65 - 99 mg/dL    Studies/Results: Ct Pelvis W Contrast  Result Date: 08/24/2016 CLINICAL DATA:  Pt c/o "deep" pelvic pain, worse on LEFT side. Pt is having difficulty urinating, but is unsure if that is  because of the pain when sitting down, or inability to urinate. Hx of posterior pelvic infection. EXAM: CT PELVIS WITH CONTRAST TECHNIQUE: Multidetector CT imaging of the pelvis was performed using the standard protocol following the bolus administration of intravenous contrast. CONTRAST:  75 ml ISOVUE-300 IOPAMIDOL (ISOVUE-300) INJECTION 61% COMPARISON:  08/20/2016 FINDINGS: Urinary Tract: Visualized distal ureters are nondistended. Urinary bladder physiologically distended. Bowel: Visualized portions of small bowel and colon are nondilated, unremarkable. Vascular/Lymphatic: Scattered iliofemoral arterial calcifications without aneurysm. Prominent bilateral inguinal lymph nodes as before, presumably reactive. No pelvic adenopathy. Reproductive: 3.9 cm right ovarian cyst. Uterus and left adnexal region unremarkable. Other:  No ascites.  No free air. Musculoskeletal: Progressive extensive inflammatory/ edematous changes and scattered subcutaneous fluid centered at the level of the left labia, extending anteriorly through the mons into the anterior pelvic body wall, and posteriorly into the left perineal region. No discrete abscess. 4.5 x 1.3 cm low attenuation collection in the region of the labia minora on the left, previously 4.4 x 1.8 cm. Ill-defined low attenuation collection previously noted at the level of the vaginal introitus on the left is less conspicuous. Facet DJD in the lower lumbar spine. No fracture or worrisome bone lesion. IMPRESSION: 1. Worsening changes of cellulitis centered at the level of the left labia, without discrete or drainable abscess collection. 2. Stable cystic lesion in the region of the left labia minora. 3. Stable benign-appearing right ovarian cyst. Recommend follow-up pelvic ultrasound in 6-12 months. This recommendation follows ACR consensus guidelines: White Paper of the ACR Incidental Findings Committee II on Adnexal Findings. J Am Coll Radiol [phone]. Electronically  Signed   By: Corlis Leak  Hassell M.D.   On: 08/24/2016 10:19    Scheduled Meds: . clotrimazole   Topical BID  . famotidine  20 mg Oral BID  . ferrous sulfate  325 mg Oral Q breakfast  . insulin aspart  0-5 Units Subcutaneous QHS  . insulin aspart  0-9 Units Subcutaneous TID WC  . insulin glargine  20 Units Subcutaneous QHS  . losartan  25 mg Oral Daily  . magnesium oxide  400 mg Oral Daily  . nicotine  21 mg Transdermal Daily  . polyethylene glycol  17 g Oral Daily  . pregabalin  100  mg Oral BID  . senna-docusate  1 tablet Oral BID  . traZODone  150 mg Oral QHS   Continuous Infusions: . heparin 2,400 Units/hr (08/27/16 0849)  . piperacillin-tazobactam (ZOSYN)  IV 3.375 g (08/27/16 0853)  . vancomycin 1,000 mg (08/27/16 1032)   PRN Meds:acetaminophen **OR** acetaminophen, EPINEPHrine, fluticasone, hydrALAZINE, HYDROmorphone (DILAUDID) injection, ondansetron, oxyCODONE-acetaminophen, SUMAtriptan, tiZANidine, zolpidem  Assessment/Plan: Patient Active Problem List   Diagnosis Date Noted  . Labial abscess 08/21/2016  . Cellulitis, perineum 08/21/2016  . Abscess of left axilla 08/21/2016  . Cellulitis of axilla, left 08/21/2016  . Chronic diastolic CHF (congestive heart failure) (HCC) 08/21/2016  . Supratherapeutic INR 08/21/2016  . Iron deficiency anemia   . Essential hypertension   . DVT (deep venous thrombosis) (HCC)   . Diabetes mellitus with renal complications (HCC)   . CKD (chronic kidney disease), stage III    Minimal improvement despite aggressive Abx The erythema has gotten better. Continue Vancomycin and Zosyn. Consider GYN/ONC referral. Will schedule vulvar biopsy in [facility] for Wednesday pm. Stop heparin gtt four hours prior to procedure. Will resume postoperatively.  May need tertiary care center referral.   LOS: 6 days   Reva Bores, MD 08/27/2016 12:08 PM

## 2016-08-27 NOTE — Progress Notes (Addendum)
PROGRESS NOTE  Hannah [name] JWJ:[id number] DOB: 14-Nov-1960 DOA: 08/21/2016 PCP: Anselmo PicklerAchreja, Manjeet Kaur, MD  HPI/Recap of past 24 hours:  remain in pain,  Continue Requiring iv dilaudid and oral percocet frequently, she remain on iv zosyn/vanc and heparin drip She remain has poor oral intake, does not eat much  no drainage, no fever   Assessment/Plan: Principal Problem:   Labial abscess Active Problems:   Iron deficiency anemia   Essential hypertension   DVT (deep venous thrombosis) (HCC)   Diabetes mellitus with renal complications (HCC)   Cellulitis, perineum   Abscess of left axilla   Cellulitis of axilla, left   Chronic diastolic CHF (congestive heart failure) (HCC)   Supratherapeutic INR   CKD (chronic kidney disease), stage III    Labial abscess and perineal cellulitis, persistent leukocytosis -- Blood cultures x 2 no growth, mrsa screening negative -  repeat ct pel with contrast on 8/10 remain significant inflammation/infection, but no drainable collection.   -continue vancomycin and Zosyn per pharmacy, no significant improvement yet-labs showed persistent leukocytosis, she continue to require iv and oral narcotic for pain control,  -OB/GYN input appreciated, case discussed with gyn , plan to biopsy on Wednesday at [facility], continue abx, continue heparin drip ( Gyn is to manage heparin drip perioperatively)  Abscess/cellulitis of left axillary area: Left axilla abscess spontaneously ruptured,  Wound care consulted, input appreciated  Hypokalemia:  mag 1.7 k improved to 3.4, continue to replace, recheck in am  Diabetes mellitus with renal complications: previously not on insulin -Last A1c is 11.9. Patient is taking Amaryl at home --diabetes education, I have discussed with  patient about diabetes disease management and  Plan to start her on  Insulin, she reports currently does not have insurance, case manager consulted.  -blood sugar remain elevated, continue to  increase long acting insulin, continue ssi, continue adjust insulin Long acting insulin changed to insulin 70/30 for affordability ( patient dose not have insurance)  Hx of DVT, recurent: on coumadin with supratherapeutic.  --hold coumadin now, heparin drip started on 8/9 once inr down, continue on heparin drip in case of needing surgical intervention   Chronic diastolic CHF (congestive heart failure) (HCC): Patient does not have leg edema JVD. CHF is compensated. 2-D echo on 08/21/16 showed EF 60-65%. [facility] after patient received 3rd units of FFP and one dose of Lasix, patient developed diffuse itchy and urticarial rash in her trunk, bilateral thighs. Not sure if pt is allergic to FFP or lasix. It may better to avoid using lasix in future. -BNP 419 -volume status stable so far, good urine output,   Sudden onset of substernal chest pain/epigastric pain after being repositioned in bed on 8/9, vital stable, no hypoxia, on exam with mild epigastric tenderness, no guarding, no rebound, STAT EKG no acute interval changes compare to the on obtained on admission ( personally reviewed) cycle troponin flat  -echocardiogram from outside hospital unremarkable -Improved on pepcid, continue  Hypertension: -IV hydralazine when necessary -Continue Cozaar,   CKD (chronic kidney disease), stage III: Stable. Creatinine is 1.07 on admission. To follow up renal function by BMP, renal dosing meds  Iron deficiency anemia:  -Continue iron supplement  Tobacco abuse: -Did counseling about importance of quitting smoking -Nicotine patch  Obesity: Body mass index is 38.3 kg/m.  DVT ppx: admitted with supratherapeutic INR,  heparin drip started on 8/9 once inr trending down Code Status: Full code Family Communication: patient  Disposition Plan:  not ready  for discharge  Consultants:  Gyn   Procedures:  none  Antibiotics:  vanc/zosyn   Objective: BP (!) 150/68 (BP Location:  Right Arm)   Pulse 89   Temp 98.4 F (36.9 C) (Oral)   Resp 18   Ht 5\' 8"  (1.727 m)   Wt 114.3 kg (251 lb 14.4 oz)   SpO2 99%   BMI 38.30 kg/m   Intake/Output Summary (Last 24 hours) at 08/27/16 1011 Last data filed at 08/27/16 0400  Gross per 24 hour  Intake            671.2 ml  Output                0 ml  Net            671.2 ml   Filed Weights   08/24/16 0457 08/25/16 0534 08/27/16 0519  Weight: 114.4 kg (252 lb 3.2 oz) 114.8 kg (253 lb) 114.3 kg (251 lb 14.4 oz)    Exam: Patient is examined daily including today on 08/27/2016, exam remain the same as of yesterday except that is changed   General:  Obese, NAD, remain in pain  Cardiovascular: RRR  Respiratory: CTABL  Abdomen: Soft/ND/NT, positive BS, induration, tender more on the left side, warm to touch mons, left inguinal area, left labial   Musculoskeletal: No Edema  Neuro: aaox3  Data Reviewed: Basic Metabolic Panel:  Recent Labs Lab 08/22/16 0409 08/24/16 0320 08/25/16 0409 08/26/16 0432 08/27/16 0231  NA 137 137 136 137 137  K 3.8 4.2 3.2* 3.4* 3.6  CL 107 103 105 103 103  CO2 21* 21* 25 25 30   GLUCOSE 201* 242* 279* 199* 228*  BUN 25* 21* 14 9 7   CREATININE 0.99 1.00 0.82 0.87 0.80  CALCIUM 8.8* 8.9 8.2* 8.2* 8.3*  MG  --   --   --  1.7 1.7   Liver Function Tests:  Recent Labs Lab 08/26/16 1820  AST 13*  ALT 17  ALKPHOS 73  BILITOT 0.8  PROT 6.2*  ALBUMIN 1.8*   No results for input(s): LIPASE, AMYLASE in the last 168 hours. No results for input(s): AMMONIA in the last 168 hours. CBC:  Recent Labs Lab 08/21/16 2358  08/23/16 1027 08/24/16 0320 08/25/16 0409 08/26/16 0432 08/27/16 0231  WBC 19.3*  < > 13.4* 18.8* 17.5* 18.8* 18.7*  NEUTROABS 16.9*  --  9.5* 14.1*  --   --   --   HGB 12.8  < > 13.3 15.5* 12.4 12.5 12.2  HCT 39.3  < > 40.5 45.6 37.4 37.3 36.6  MCV 89.1  < > 89.4 91.2 89.9 88.4 89.1  PLT 250  < > 283 278 278 311 315  < > = values in this interval not  displayed. Cardiac Enzymes:    Recent Labs Lab 08/22/16 1814 08/22/16 2243 08/23/16 0515  TROPONINI 0.09* 0.06* <0.03   BNP (last 3 results)  Recent Labs  08/21/16 2358  BNP 419.0*    ProBNP (last 3 results) No results for input(s): PROBNP in the last 8760 hours.  CBG:  Recent Labs Lab 08/26/16 0807 08/26/16 1215 08/26/16 1711 08/26/16 2227 08/27/16 0753  GLUCAP 210* 239* 169* 244* 249*    Recent Results (from the past 240 hour(s))  Urine Culture     Status: None   Collection Time: 08/21/16 11:15 PM  Result Value Ref Range Status   Specimen Description URINE, RANDOM  Final   Special Requests NONE  Final   Culture  NO GROWTH  Final   Report Status 08/23/2016 FINAL  Final  Culture, blood (Routine X 2) w Reflex to ID Panel     Status: None (Preliminary result)   Collection Time: 08/21/16 11:58 PM  Result Value Ref Range Status   Specimen Description BLOOD RIGHT HAND  Final   Special Requests   Final    BOTTLES DRAWN AEROBIC AND ANAEROBIC Blood Culture adequate volume   Culture NO GROWTH 4 DAYS  Final   Report Status PENDING  Incomplete  Culture, blood (Routine X 2) w Reflex to ID Panel     Status: None (Preliminary result)   Collection Time: 08/22/16 12:08 AM  Result Value Ref Range Status   Specimen Description BLOOD LEFT HAND  Final   Special Requests   Final    BOTTLES DRAWN AEROBIC AND ANAEROBIC Blood Culture adequate volume   Culture NO GROWTH 4 DAYS  Final   Report Status PENDING  Incomplete  MRSA PCR Screening     Status: None   Collection Time: 08/26/16 11:32 AM  Result Value Ref Range Status   MRSA by PCR NEGATIVE NEGATIVE Final    Comment:        The GeneXpert MRSA Assay (FDA approved for NASAL specimens only), is one component of a comprehensive MRSA colonization surveillance program. It is not intended to diagnose MRSA infection nor to guide or monitor treatment for MRSA infections.      Studies: No results found.  Scheduled  Meds: . clotrimazole   Topical BID  . famotidine  20 mg Oral BID  . ferrous sulfate  325 mg Oral Q breakfast  . insulin aspart  0-5 Units Subcutaneous QHS  . insulin aspart  0-9 Units Subcutaneous TID WC  . insulin glargine  20 Units Subcutaneous QHS  . losartan  25 mg Oral Daily  . magnesium oxide  400 mg Oral Daily  . nicotine  21 mg Transdermal Daily  . polyethylene glycol  17 g Oral Daily  . pregabalin  100 mg Oral BID  . senna-docusate  1 tablet Oral BID  . traZODone  150 mg Oral QHS    Continuous Infusions: . heparin 2,400 Units/hr (08/27/16 0849)  . piperacillin-tazobactam (ZOSYN)  IV 3.375 g (08/27/16 0853)  . vancomycin Stopped (08/26/16 2332)      Amori Colomb MD, PhD  [facility] Pager [phone]. If 7PM-7AM, please contact night-coverage at [email or web address], password [facility] 08/27/2016, 10:11 AM  LOS: 6 days

## 2016-08-27 NOTE — Progress Notes (Signed)
Physical Therapy Treatment Patient Details Name: Hannah [name] MRN: [id number] DOB: 09-23-1960 Today's Date: 08/27/2016    History of Present Illness Pt is a 56 y/o female admitted secondary to abscess and cellulitis in perineal and L axillar areas. PMH including but not limited to HTN, depression, DM, CHF and CKD.    PT Comments    Pt is mobilizing well. Ambulated in hall with supervision. Pts bed mobilities and peri care are currently limited by pain. Anticipate they will improve quickly as wound heals. Pt would benefit from continued skilled PT to increase functional independence. Will continue to follow acutely.    Follow Up Recommendations  No PT follow up     Equipment Recommendations  Rolling walker with 5" wheels    Recommendations for Other Services       Precautions / Restrictions Restrictions Weight Bearing Restrictions: No    Mobility  Bed Mobility Overal bed mobility: Needs Assistance Bed Mobility: Supine to Sit;Sit to Supine     Supine to sit: Min assist;HOB elevated Sit to supine: Min guard   General bed mobility comments: On arrival to room pt attempting to rise out of bed with GYN present. Physical assist given to elevate trunk and cueing for technique. To return to bed, put climbs in forward to avoid pain; unconventional technique but overall safe.  Transfers Overall transfer level: Needs assistance Equipment used: Rolling walker (2 wheeled) Transfers: Sit to/from Stand Sit to Stand: Supervision         General transfer comment: increased time, supervision for safety. Pt places RW far forward as she has a wide stance due to absess.  Ambulation/Gait Ambulation/Gait assistance: Min guard Ambulation Distance (Feet): 400 Feet Assistive device: Rolling walker (2 wheeled) Gait Pattern/deviations: Step-through pattern;Decreased step length - right;Decreased step length - left;Decreased stride length;Wide base of support Gait velocity:  decreased Gait velocity interpretation: Below normal speed for age/gender General Gait Details: wide BOS due to pain, pt with little use of UEs on RW, used more for steadying. At this time pt unable to maintain proper placement in walker due to wide BOS. Keeps walker out in front.   Stairs            Wheelchair Mobility    Modified Rankin (Stroke Patients Only)       Balance Overall balance assessment: Needs assistance Sitting-balance support: Feet supported Sitting balance-Leahy Scale: Good     Standing balance support: During functional activity;No upper extremity supported Standing balance-Leahy Scale: Fair                              Cognition Arousal/Alertness: Awake/alert Behavior During Therapy: WFL for tasks assessed/performed Overall Cognitive Status: Within Functional Limits for tasks assessed                                        Exercises      General Comments General comments (skin integrity, edema, etc.): Assisted pt to bathroom and with peri care       Pertinent Vitals/Pain Pain Assessment: Faces Faces Pain Scale: Hurts whole lot Pain Location: L groin Pain Descriptors / Indicators: Sore;Grimacing;Guarding Pain Intervention(s): Monitored during session;Limited activity within patient's tolerance;Patient requesting pain meds-RN notified    Home Living  Prior Function            PT Goals (current goals can now be found in the care plan section) Acute Rehab PT Goals Patient Stated Goal: decrease pain PT Goal Formulation: With patient Time For Goal Achievement: 09/06/16 Potential to Achieve Goals: Good Progress towards PT goals: Progressing toward goals    Frequency    Min 3X/week      PT Plan Current plan remains appropriate    Co-evaluation              AM-PAC PT "6 Clicks" Daily Activity  Outcome Measure  Difficulty turning over in bed (including adjusting  bedclothes, sheets and blankets)?: None Difficulty moving from lying on back to sitting on the side of the bed? : Total Difficulty sitting down on and standing up from a chair with arms (e.g., wheelchair, bedside commode, etc,.)?: A Little Help needed moving to and from a bed to chair (including a wheelchair)?: A Little Help needed walking in hospital room?: A Little Help needed climbing 3-5 steps with a railing? : A Little 6 Click Score: 17    End of Session   Activity Tolerance: Patient tolerated treatment well Patient left: in bed;with call bell/phone within reach Nurse Communication: Mobility status PT Visit Diagnosis: Other abnormalities of gait and mobility (R26.89);Pain Pain - part of body:  (groin)     Time: [id number] PT Time Calculation (min) (ACUTE ONLY): 22 min  Charges:  $Gait Training: 8-22 mins                    G Codes:       Kallie LocksHannah Garrett Mitchum, [place]PTA Pager [phone] Acute Rehab   Sheral ApleyHannah E Vanellope Passmore 08/27/2016, 12:40 PM

## 2016-08-27 NOTE — Progress Notes (Signed)
Pharmacy Antibiotic Note  Hannah [name] is a 56 y.o. female admitted on 08/21/2016 with abscess and cellulitis in perineal and left axillar areas.  Pharmacy has been consulted for Vancomycin and Zosyn dosing.  Today is antibiotic day #7 ([facility]  Pt with little clinical improvement.  WBC remains elevated.  CT scan from 8/10 showed no drainable abscess.  Noted plans for GYN biopsy Wednesday 8/15.  Plan: Vancomycin 1000mg  IV q12h  Zosyn 3.375 gm IV q8h (4 hour infusion) Follow-up plans re: Vanc length of theraqpy and check trough if continued.  Height: 5\' 8"  (172.7 cm) Weight: 251 lb 14.4 oz (114.3 kg) IBW/kg (Calculated) : 63.9  Temp (24hrs), Avg:98.3 F (36.8 C), Min:98.2 F (36.8 C), Max:98.4 F (36.9 C)   Recent Labs Lab 08/21/16 2358 08/22/16 0409 08/23/16 1027 08/24/16 0320 08/24/16 1955 08/25/16 0409 08/26/16 0432 08/27/16 0231  WBC 19.3* 17.9* 13.4* 18.8*  --  17.5* 18.8* 18.7*  CREATININE 1.07* 0.99  --  1.00  --  0.82 0.87 0.80  LATICACIDVEN 1.8  --   --   --   --   --   --  1.0  VANCOTROUGH  --   --   --   --  10*  --   --   --     Estimated Creatinine Clearance: 101.8 mL/min (by C-G formula based on SCr of 0.8 mg/dL).    Allergies  Allergen Reactions  . Naproxen Other (See Comments)    Emotional and cramps    Antimicrobials this admission: Bactrim outpt PTA 8/3 >>8/6 Vanc 8/6 >> * [facility] - last dose 8/7 @ 1130 Zosyn 8/6 >> * 3.375gm [facility] - last dose 8/7 @ 2000  Dose adjustments this admission: 8/10 VT = 10 on 750 mg Q 12, increased to 1g Q 12  Microbiology results: 8/8 blood x 2 >> ngtd 8/7 urine >> neg 8/12 MRSA PCR negative  Thank you for allowing pharmacy to be a part of this patient's care.  Brandon MelnickHammons, Hannah [name] 08/27/2016 4:48 PM

## 2016-08-27 NOTE — Progress Notes (Signed)
Pharmacy Insulin Pricing Information   [facility] The [facility] discounted [facility] that they have available is 70/30.   1 vial of 70/30- $ 24.88   Thank you for allowing pharmacy to be a part of this patient's care  Della GooEmily S Sinclair, PharmD PGY2 Infectious Diseases Pharmacy Resident Pager: [phone]

## 2016-08-27 NOTE — Progress Notes (Signed)
Changed iodoform and foam dressing under left arm as per orders.

## 2016-08-27 NOTE — Progress Notes (Signed)
ANTICOAGULATION CONSULT NOTE - Follow Up Consult  Pharmacy Consult for heparin Indication: h/o DVT  Labs:  Recent Labs  08/25/16 0409 08/25/16 1258 08/26/16 0432 08/27/16 0231  HGB 12.4  --  12.5 12.2  HCT 37.4  --  37.3 36.6  PLT 278  --  311 315  HEPARINUNFRC <0.10* 0.60 0.48 0.40  CREATININE 0.82  --  0.87 0.80    Assessment: 56 yo F on Coumadin PTA for hx DVT.  Pt was transitioned to heparin therapy pending the need for procedures related to her cellulitis/abscesses.    Heparin level continues to be therapeutic (0.4) on 2400 units/hr. Hgb stable, pltc wnl, no bleeding noted per chart.   Goal of Therapy:  Heparin level 0.3-0.7 units/ml Monitor platelets by anticoagulation protocol: Yes  Plan:  Continue heparin at 2400 units/hr Daily heparin level and CBC F/U plans to restart coumadin  Toys 'R' UsKimberly Deniese Oberry, Pharm.D., BCPS Clinical Pharmacist Pager: [phone] Clinical phone for 08/27/2016 from 8:30-4:00 is [phone]. After 4pm, please call Main Rx [phone]) for assistance. 08/27/2016 5:04 PM

## 2016-08-27 NOTE — Progress Notes (Signed)
Inpatient Diabetes Program Recommendations  AACE/ADA: New Consensus Statement on Inpatient Glycemic Control (2015)  Target Ranges:  Prepandial:   less than 140 mg/dL      Peak postprandial:   less than 180 mg/dL (1-2 hours)      Critically ill patients:  140 - 180 mg/dL   Results for Hannah [name], Hannah [name] (MRN [id number]) as of 08/27/2016 12:04  Ref. Range 08/26/2016 08:07 08/26/2016 12:15 08/26/2016 17:11 08/26/2016 22:27 08/27/2016 07:53  Glucose-Capillary Latest Ref Range: 65 - 99 mg/dL 102210 (H) 725239 (H) 366169 (H) 244 (H) 249 (H)  Results for Hannah [name], Hannah [name] (MRN [id number]) as of 08/27/2016 12:04  Ref. Range 08/22/2016 04:09  Hemoglobin A1C Latest Ref Range: 4.8 - 5.6 % 11.9 (H)   Review of Glycemic Control  Current orders for Inpatient glycemic control: Lantus 20 units QHS, Novolog 0-9 units TID with meals, Novolog 0-5 units QHS  Inpatient Diabetes Program Recommendations: Insulin - Basal: Since patient will need affordable insulin at discharge, recommend discontinuing Lantus as ordered and ordering 70/30 14 units BID starting with supper today (dose will provide a total of 20 units for basal and 8 units for meal coverage per [name]). HgbA1C: A1C 11.9% indicating an average glucose of 295 mg/dl. Patient has no insurance and will need affordable DM medications. Recommend discharging patient on NOVOLIN 70/30.   Thanks, Orlando PennerMarie Delois Tolbert, RN, MSN, CDE Diabetes Coordinator Inpatient Diabetes Program [phone] (Team Pager from 8am to 5pm)

## 2016-08-28 ENCOUNTER — Encounter (HOSPITAL_COMMUNITY): Payer: Self-pay | Admitting: Gynecologic Oncology

## 2016-08-28 DIAGNOSIS — N764 Abscess of vulva: Principal | ICD-10-CM

## 2016-08-28 DIAGNOSIS — I82409 Acute embolism and thrombosis of unspecified deep veins of unspecified lower extremity: Secondary | ICD-10-CM

## 2016-08-28 DIAGNOSIS — N762 Acute vulvitis: Secondary | ICD-10-CM

## 2016-08-28 DIAGNOSIS — E119 Type 2 diabetes mellitus without complications: Secondary | ICD-10-CM

## 2016-08-28 DIAGNOSIS — Z7901 Long term (current) use of anticoagulants: Secondary | ICD-10-CM

## 2016-08-28 DIAGNOSIS — N183 Chronic kidney disease, stage 3 (moderate): Secondary | ICD-10-CM

## 2016-08-28 LAB — GLUCOSE, CAPILLARY
GLUCOSE-CAPILLARY: 163 mg/dL — AB (ref 65–99)
GLUCOSE-CAPILLARY: 198 mg/dL — AB (ref 65–99)
Glucose-Capillary: 222 mg/dL — ABNORMAL HIGH (ref 65–99)
Glucose-Capillary: 207 mg/dL — ABNORMAL HIGH (ref 65–99)

## 2016-08-28 LAB — BASIC METABOLIC PANEL
Anion gap: 9 (ref 5–15)
BUN: 6 mg/dL (ref 6–20)
CALCIUM: 8.6 mg/dL — AB (ref 8.9–10.3)
CO2: 25 mmol/L (ref 22–32)
CREATININE: 0.94 mg/dL (ref 0.44–1.00)
Chloride: 104 mmol/L (ref 101–111)
GFR calc Af Amer: >60 mL/min (ref >60)
GLUCOSE: 164 mg/dL — AB (ref 65–99)
POTASSIUM: 3.6 mmol/L (ref 3.5–5.1)
SODIUM: 138 mmol/L (ref 135–145)

## 2016-08-28 LAB — CBC
HCT: 37.7 % (ref 36.0–46.0)
HEMOGLOBIN: 12.5 g/dL (ref 12.0–15.0)
MCH: 29.6 pg (ref 26.0–34.0)
MCHC: 33.2 g/dL (ref 30.0–36.0)
MCV: 89.3 fL (ref 78.0–100.0)
PLATELETS: 347 10*3/uL (ref 150–400)
RBC: 4.22 MIL/uL (ref 3.87–5.11)
RDW: 14.2 % (ref 11.5–15.5)
WBC: 21.4 10*3/uL — ABNORMAL HIGH (ref 4.0–10.5)

## 2016-08-28 LAB — HEPARIN LEVEL (UNFRACTIONATED): HEPARIN UNFRACTIONATED: 0.46 [IU]/mL (ref 0.30–0.70)

## 2016-08-28 LAB — VANCOMYCIN, TROUGH: Vancomycin Tr: 16 ug/mL (ref 15–20)

## 2016-08-28 MED ORDER — INSULIN ASPART 100 UNIT/ML ~~LOC~~ SOLN
3.0000 [IU] | Freq: Three times a day (TID) | SUBCUTANEOUS | Status: DC
  Administered 2016-08-29 – 2016-09-02 (×11): 3 [IU] via SUBCUTANEOUS

## 2016-08-28 MED ORDER — CLINDAMYCIN PHOSPHATE 600 MG/50ML IV SOLN
600.0000 mg | Freq: Three times a day (TID) | INTRAVENOUS | Status: DC
  Administered 2016-08-28 – 2016-08-30 (×5): 600 mg via INTRAVENOUS
  Filled 2016-08-28 (×6): qty 50

## 2016-08-28 NOTE — Progress Notes (Signed)
ANTICOAGULATION CONSULT NOTE - Follow Up Consult  Pharmacy Consult for heparin Indication: h/o DVT  Labs:  Recent Labs  08/26/16 0432 08/27/16 0231 08/28/16 0257  HGB 12.5 12.2 12.5  HCT 37.3 36.6 37.7  PLT 311 315 347  HEPARINUNFRC 0.48 0.40 0.46  CREATININE 0.87 0.80 0.94    Assessment: 56 yo F on Coumadin PTA for hx DVT.  Pt was transitioned to heparin therapy pending the need for procedures related to her cellulitis/abscesses.    Heparin level continues to be therapeutic on 2400 units/hr. Hgb stable, pltc wnl, no bleeding noted per chart.   Goal of Therapy:  Heparin level 0.3-0.7 units/ml Monitor platelets by anticoagulation protocol: Yes  Plan:  Continue heparin at 2400 units/hr Daily heparin level and CBC F/U plans to restart coumadin  Toys 'R' UsKimberly Makayia Duplessis, Pharm.D., BCPS Clinical Pharmacist Pager: [phone] Clinical phone for 08/28/2016 from 8:30-4:00 is [phone]. After 4pm, please call Main Rx [phone]) for assistance. 08/28/2016 12:09 PM

## 2016-08-28 NOTE — Consult Note (Signed)
Gynecologic Oncology Consultation  Tresa Endo Day [facility] 56 y.o. female   HPI:  Morganna Styles is a 56 year old female, G0, seen inpatient at the request of Dr. Tinnie Gens for labial cellulitis, questionable vulvar mass.  She was admitted to [facility] on August 20, 2016 for a labial abscess.  She noted the development of the labial abscess on August 2 with increased pain at a level 6 out of 10.  She states due to financial reasons, she did not seek treatment until the next day.  She also had fever and chills.  She was started on antibiotics prior to admission by her gynecologist but sought evaluation at the hospital due to worsening symptoms.  Surgery was deferred and she was advised to go to [facility] from [place] due to elevation in INR and multiple medical co-morbidities.  Blood cultures from 08/21/16 and urine culture with no growth.  CT pelvis performed on 08/24/16 resulting worsening changes in cellulitis without drainable abscess collection, stable left labia minora cystic lesion, stable right ovarian cyst.  She has currently completed eight days of IV vancomycin and zosyn with mild improvement in symptoms and WBC count.     Medical history includes heart failure, kidney disease, diabetes, DVT on Coumadin, hypertension, current smoker, and anemia.  Of note, she was seen by Dr. 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 Gae Gallop, GYN.  IUD placement was unsuccessful and she was started on provera which she discontinued on her own due to DVT history.     Interval History:  She states the abscess has started draining this am and her pain is improving.  With ambulation, she reports moderate amount of serosanguinous drainage from the left labia.  The left mons and labial firmness is improving per patient.  Bowels and bladder functioning without difficulty.  Ambulating with assist.  She states she stopped  taking her provera two weeks ago due to fear of a blood clot.  She reports a history of a blood clot twice with the first when she was 16 due to birth control pills.    Review of Systems  Constitutional: Feels better since labia is draining.  No fever but reports intermittent chills.  Purposeful weight loss per pt (43 lbs).  Cardiovascular: Shortness of breath intermittently.  No chest pain or edema.  LLE larger than the right but non-pitting (not a new finding per pt)  Pulmonary: No cough or wheeze.  Gastrointestinal: No nausea, vomiting, or diarrhea. No bright red blood per rectum or change in bowel movement.  Genitourinary: No frequency, urgency, or dysuria. No vaginal bleeding or discharge.  Musculoskeletal: No myalgia or joint pain. Neurologic: No weakness, numbness, or change in gait.  Psychology: No depression, anxiety, or insomnia.   Current Facility-Administered Medications:  .  acetaminophen (TYLENOL) tablet 650 mg, 650 mg, Oral, Q6H PRN **OR** acetaminophen (TYLENOL) suppository 650 mg, 650 mg, Rectal, Q6H PRN, Lorretta Harp, MD .  clotrimazole (LOTRIMIN) 1 % cream, , Topical, BID, Lorretta Harp, MD .  EPINEPHrine (ADRENALIN) 0.3 mg, 0.3 mg, Intramuscular, PRN, Cherre Huger, RPH .  famotidine (PEPCID) tablet 20 mg, 20 mg, Oral, BID, Albertine Grates, MD, 20 mg at 08/28/16 0844 .  ferrous sulfate tablet 325 mg, 325 mg, Oral, Q breakfast, Lorretta Harp, MD, 325 mg at 08/28/16 0841 .  fluticasone (FLONASE) 50 MCG/ACT nasal spray 2 spray, 2 spray, Each Nare, Daily PRN, Lorretta Harp, MD .  heparin ADULT infusion 100 units/mL (25000 units/24050mL sodium chloride 0.45%), 2,400 Units/hr, Intravenous, Continuous, Juliette MangleBryk, Veronda P, RPH, Last Rate: 24 mL/hr at 08/28/16 0655, 2,400 Units/hr at 08/28/16 0655 .  hydrALAZINE (APRESOLINE) injection 5 mg, 5 mg, Intravenous, Q2H PRN, Lorretta HarpNiu, Xilin, MD .  HYDROmorphone (DILAUDID) injection 1 mg, 1 mg, Intravenous, Q3H PRN, Albertine GratesXu, Fang, MD .  insulin aspart (novoLOG)  injection 0-5 Units, 0-5 Units, Subcutaneous, QHS, Lorretta HarpNiu, Xilin, MD, 2 Units at 08/26/16 2242 .  insulin aspart (novoLOG) injection 0-9 Units, 0-9 Units, Subcutaneous, TID WC, Lorretta HarpNiu, Xilin, MD, 3 Units at 08/28/16 1221 .  insulin aspart protamine- aspart (NOVOLOG MIX 70/30) injection 14 Units, 14 Units, Subcutaneous, BID WC, Albertine GratesXu, Fang, MD, 14 Units at 08/28/16 [id number] .  losartan (COZAAR) tablet 25 mg, 25 mg, Oral, Daily, Lorretta HarpNiu, Xilin, MD, 25 mg at 08/28/16 [phone] .  magnesium oxide (MAG-OX) tablet 400 mg, 400 mg, Oral, Daily, Albertine GratesXu, Fang, MD, 400 mg at 08/28/16 0842 .  nicotine (NICODERM CQ - dosed in mg/24 hours) patch 21 mg, 21 mg, Transdermal, Daily, Lorretta HarpNiu, Xilin, MD, 21 mg at 08/26/16 0825 .  ondansetron (ZOFRAN) injection 4 mg, 4 mg, Intravenous, Q8H PRN, Lorretta HarpNiu, Xilin, MD .  oxyCODONE-acetaminophen (PERCOCET/ROXICET) 5-325 MG per tablet 1 tablet, 1 tablet, Oral, Q4H PRN, Lorretta HarpNiu, Xilin, MD, 1 tablet at 08/28/16 1118 .  piperacillin-tazobactam (ZOSYN) IVPB 3.375 g, 3.375 g, Intravenous, Q8H, Hammons, Kimberly B, RPH, Stopped at 08/28/16 1240 .  polyethylene glycol (MIRALAX / GLYCOLAX) packet 17 g, 17 g, Oral, Daily, Albertine GratesXu, Fang, MD, 17 g at 08/25/16 1036 .  pregabalin (LYRICA) capsule 100 mg, 100 mg, Oral, BID, Lorretta HarpNiu, Xilin, MD, 100 mg at 08/28/16 0842 .  senna-docusate (Senokot-S) tablet 1 tablet, 1 tablet, Oral, BID, Albertine GratesXu, Fang, MD, 1 tablet at 08/28/16 [phone] .  SUMAtriptan (IMITREX) tablet 50 mg, 50 mg, Oral, Q2H PRN, Lorretta HarpNiu, Xilin, MD .  tiZANidine (ZANAFLEX) tablet 4 mg, 4 mg, Oral, Q8H PRN, Lorretta HarpNiu, Xilin, MD .  traZODone (DESYREL) tablet 150 mg, 150 mg, Oral, QHS, Lorretta HarpNiu, Xilin, MD, 150 mg at 08/27/16 2117 .  vancomycin (VANCOCIN) IVPB 1000 mg/200 mL premix, 1,000 mg, Intravenous, Q12H, Silvana NewnessMeyer, Andrew D, RPH, Stopped at 08/28/16 0940 .  zolpidem (AMBIEN) tablet 5 mg, 5 mg, Oral, QHS PRN, Lorretta HarpNiu, Xilin, MD  Allergy:  Allergies  Allergen Reactions  . Naproxen Other (See Comments)    Emotional Reaction Cramps    Social Hx:    Social History   Social History  . Marital status: Married    Spouse name: N/A  . Number of children: N/A  . Years of education: N/A   Occupational History  . Not on file.   Social History Main Topics  . Smoking status: Current Every Day Smoker    Packs/day: 1.50    Years: 15.00    Types: Cigarettes  . Smokeless tobacco: Never Used  . Alcohol use No  . Drug use: No  . Sexual activity: Not on file   Other Topics Concern  . Not on file   Social History Narrative  . No narrative on file    Past Surgical Hx:  Past Surgical History:  Procedure Laterality Date  . BACK SURGERY      Past Medical Hx:  Past Medical History:  Diagnosis Date  . Chronic diastolic (congestive) heart failure (HCC)   . CKD (chronic kidney disease), stage III   . Diabetes mellitus without complication (HCC)   . DVT (deep venous thrombosis) (HCC)   .  Essential hypertension   . Iron deficiency anemia     Family Hx:  Family History  Problem Relation Age of Onset  . Hypertension Mother   . Diabetes Mellitus II Father   . Kidney disease Father    CBC    Component Value Date/Time   WBC 21.4 (H) 08/28/2016 0257   RBC 4.22 08/28/2016 0257   HGB 12.5 08/28/2016 0257   HCT 37.7 08/28/2016 0257   PLT 347 08/28/2016 0257   MCV 89.3 08/28/2016 0257   MCH 29.6 08/28/2016 0257   MCHC 33.2 08/28/2016 0257   RDW 14.2 08/28/2016 0257   LYMPHSABS 3.0 08/24/2016 0320   MONOABS 1.3 (H) 08/24/2016 0320   EOSABS 0.4 08/24/2016 0320   BASOSABS 0.0 08/24/2016 0320   CMP Latest Ref Rng & Units 08/28/2016 08/27/2016 08/26/2016  Glucose 65 - 99 mg/dL 161(W) 960(A) 540(J)  BUN 6 - 20 mg/dL 6 7 9   Creatinine 0.44 - 1.00 mg/dL 8.11 9.14 7.82  Sodium 135 - 145 mmol/L 138 137 137  Potassium 3.5 - 5.1 mmol/L 3.6 3.6 3.4(L)  Chloride 101 - 111 mmol/L 104 103 103  CO2 22 - 32 mmol/L 25 30 25   Calcium 8.9 - 10.3 mg/dL 9.5(A) 8.3(L) 8.2(L)  Total Protein 6.5 - 8.1 g/dL - - 6.2(L)  Total Bilirubin 0.3 - 1.2  mg/dL - - 0.8  Alkaline Phos 38 - 126 U/L - - 73  AST 15 - 41 U/L - - 13(L)  ALT 14 - 54 U/L - - 17    Vitals:  Blood pressure (!) 147/68, pulse 86, temperature 98.4 F (36.9 C), temperature source Oral, resp. rate 18, height 5\' 8"  (1.727 m), weight 252 lb (114.3 kg), SpO2 95 %.  Physical Exam:  General: Well developed, well nourished female in no acute distress. Alert and oriented x 3.  Cardiovascular: Regular rate and rhythm. S1 and S2 normal.  Lungs: Clear to auscultation bilaterally. No wheezes/crackles/rhonchi noted.  Abdomen: Abdomen soft, non-tender and obese. Active bowel sounds in all quadrants.  Genitourinary:    Vulva/vagina: Firmness noted to the left mons (improved per pt).  Mons large with no erythema or drainage.  Firmness extended down the mons to the left labia majora.  Left labia with small opening with sanguinous drainage present.  Peripad moderately saturated with serosanguinous drainage. Extremities: No bilateral cyanosis, edema, or clubbing. LLE larger than RLE.  Assessment/Plan: Dr. Andrey Farmer to evaluate the patient this afternoon.  Continue IV antibiotics as ordered at this time.      CROSS, MELISSA DEAL, NP 08/28/2016, 12:05 PM   Patient seen and examined by me and I agree with the above note, assessment and plan. I have reviewed her CT images from 08/24/16.  Ms Millington is a 56 year old morbidly obese woman with poorly controlled diabetes mellitus among other major medical comorbidities (including anticoagulation for recurrent VTE's and renal insufficiency) who was seen in consultation at the request of Dr Shawnie Pons for vulvar cellulitis and abscess and induration. She has received 8-9 days of parenteral antibiotics (zosyn and vanc) without clinical improvement though she has not developed fevers. Her WBC remains very elevated. Today her vulvar abscess began spontaneously draining from the left labia majora.  When that occurred she began feeling immediate improvement in  pain symptoms.  On my examination the mons is extremely edematous with peau d'orange changes. THere is erythema and induration of the left > right labia majora with a 2cm area of fluctuance on the midportion of the  left labia majora. At this site is a small drainage hole with copious foul smelling blood tinged purulent fluid spilling with gentle applciation of pressure. There is no crepitus in the skin and the surrounding skin is not painful, however, the drainage site is. The labia minora are also very edematous.  A&P/ Vulvar cellulitis and abscess. It is spontaneously draining now, though through a small defect. I recommend examination under anesthesia and I&D to open the area of drainage more substantially. Cultures have already been sent. It will likely require some form of packing to assist in healing. The patient is familiar with this as she has had packing for a perianal abscess in the past.  I recommend adding clindamycin 600-900mg  TID to her antibiotic regimen as this gives additional gram negative coverage.  If her clinical condition deteriorates I recommend repeating CT imaging to evaluate for signs of necrotizing infection in which case she may require extensive debridement. I do not see clinical evidence for this at this time.  Obviously she needs optimization of her blood glucose control in order to enhance the likelihood that she will heal this infection and wound. I conveyed these recommendations to Dr Shawnie Pons by phone who has plans for I&D tomorrow.  Please contact me with further questions or concerns. Quinn Axe, MD Cell [phone]

## 2016-08-28 NOTE — Progress Notes (Signed)
Pharmacy Antibiotic Note  Hannah [name] is a 56 y.o. female admitted on 08/21/2016 with abscess and cellulitis in perineal and left axillar areas.  Pharmacy has been consulted for Vancomycin and Zosyn dosing.  Today is antibiotic day #8 ([facility]  Pt with little clinical improvement.  WBC remains elevated.  CT scan from 8/10 showed no drainable abscess.  Noted plans for GYN biopsy Wednesday 8/15.  Vancomycin trough = 16 this evening  Plan: Vancomycin 1000mg  IV q12h  Zosyn 3.375 gm IV q8h (4 hour infusion) Follow-up plans re: Vanc length of therapy  Height: 5\' 8"  (172.7 cm) Weight: 252 lb (114.3 kg) IBW/kg (Calculated) : 63.9  Temp (24hrs), Avg:98.4 F (36.9 C), Min:98.4 F (36.9 C), Max:98.4 F (36.9 C)   Recent Labs Lab 08/21/16 2358  08/24/16 0320 08/24/16 1955 08/25/16 0409 08/26/16 0432 08/27/16 0231 08/28/16 0257 08/28/16 2030  WBC 19.3*  < > 18.8*  --  17.5* 18.8* 18.7* 21.4*  --   CREATININE 1.07*  < > 1.00  --  0.82 0.87 0.80 0.94  --   LATICACIDVEN 1.8  --   --   --   --   --  1.0  --   --   VANCOTROUGH  --   --   --  10*  --   --   --   --  16  < > = values in this interval not displayed.  Estimated Creatinine Clearance: 86.6 mL/min (by C-G formula based on SCr of 0.94 mg/dL).    Allergies  Allergen Reactions  . Naproxen Other (See Comments)    Emotional Reaction Cramps     Thank you for allowing pharmacy to be a part of this patient's care. Okey RegalLisa Cristopher Ciccarelli, PharmD [phone] 08/28/2016 9:49 PM

## 2016-08-28 NOTE — Progress Notes (Signed)
After consultation with my partners, we are going to have GYN/Onc see the patient in consultation today.

## 2016-08-28 NOTE — Progress Notes (Addendum)
PROGRESS NOTE  Hannah [name] ION:[id number] DOB: Dec 31, 1960 DOA: 08/21/2016 PCP: Anselmo Pickler, MD   Brief summary:  Hx of HTN, DM2, depression, anxiety, recurrent DVT on Coumadin, dCHF, iron deficiency anemia, tobacco abuse, CKD-2, who presents with abscess and cellulitis in left perineal and left axillar areas, and supratherapeutic INR 3.3- She is started on vanc/zosyn, heparin drip. Ob/gyl and gyn onc consulted-->plan possible biopsy on Wednesday at [facility].   HPI/Recap of past 24 hours:  She reports the left perineal wound start to drain with bloody drainage, left mons and labial is less firm She required less pain meds for the first time since she is admitted   She has finally able to have BM without causing much pain.  she is more mobile today due to reduced pain.  she remains on iv zosyn/vanc and heparin drip, no fever, leukocytosis did not improve though ( wbc worsening)  Husband at bedside.   Assessment/Plan: Principal Problem:   Labial abscess Active Problems:   Iron deficiency anemia   Essential hypertension   DVT (deep venous thrombosis) (HCC)   Diabetes mellitus with renal complications (HCC)   Cellulitis, perineum   Abscess of left axilla   Cellulitis of axilla, left   Chronic diastolic CHF (congestive heart failure) (HCC)   Supratherapeutic INR   CKD (chronic kidney disease), stage III    Labial abscess and perineal cellulitis, persistent leukocytosis -- Blood cultures x 2 no growth, mrsa screening negative -  repeat ct pel with contrast on 8/10 remain significant inflammation/infection, but no drainable collection.   -continue vancomycin and Zosyn per pharmacy, no significant improvement yet-labs showed persistent leukocytosis, she continue to require iv and oral narcotic for pain control,  -OB/GYN and gyn onc consulted, possible biopsy on Wednesday at [facility], continue abx, continue heparin drip ( Gyn is to manage heparin drip  perioperatively) Addendum: Added clindamycin per gyn onc recommendation  Abscess/cellulitis of left axillary area: Left axilla abscess spontaneously ruptured,  Wound care consulted, input appreciated  Hypokalemia:  mag 1.7 k improved to 3.4, continue to replace, recheck in am  Diabetes mellitus with renal complications: previously not on insulin -Last A1c is 11.9. Patient is taking Amaryl at home --diabetes education, I have discussed with  patient about diabetes disease management and  Plan to start her on  Insulin, she reports currently does not have insurance, case manager consulted.  -blood sugar remain elevated, continue to increase long acting insulin, continue ssi, continue adjust insulin -Long acting insulin changed to insulin 70/30 for affordability ( patient dose not have insurance), add scheduled meal coverage , now that patient start to eat more  Hx of DVT, recurent: on coumadin with supratherapeutic.  --hold coumadin now, heparin drip started on 8/9 once inr down, continue on heparin drip in case of needing surgical intervention   Chronic diastolic CHF (congestive heart failure) (HCC): Patient does not have leg edema JVD. CHF is compensated. 2-D echo on 08/21/16 showed EF 60-65%. In [facility], after patient received 3rd units of FFP and one dose of Lasix, patient developed diffuse itchy and urticarial rash in her trunk, bilateral thighs. Not sure if pt is allergic to FFP or lasix. It may better to avoid using lasix in future. -BNP 419 -volume status stable so far, good urine output,   Sudden onset of substernal chest pain/epigastric pain after being repositioned in bed on 8/9, vital stable, no hypoxia, on exam with mild epigastric tenderness, no guarding, no rebound, STAT EKG  no acute interval changes compare to the on obtained on admission ( personally reviewed) cycle troponin flat  -echocardiogram from outside hospital unremarkable -Improved on pepcid,  continue  Hypertension: -IV hydralazine when necessary -Continue Cozaar,   CKD (chronic kidney disease), stage III: Stable. Creatinine is 1.07 on admission. To follow up renal function by BMP, renal dosing meds  Iron deficiency anemia:  -Continue iron supplement  Tobacco abuse: -Did counseling about importance of quitting smoking -Nicotine patch  Obesity: Body mass index is 38.32 kg/m.  DVT ppx: admitted with supratherapeutic INR,  heparin drip started on 8/9 once inr trending down Code Status: Full code Family Communication: patient and husband at bedside Disposition Plan:  not ready for discharge  Consultants:  Gyn   Gyn onc  Procedures:  none  Antibiotics:  Vanc/zosyn since admission   Objective: BP (!) 147/68 (BP Location: Right Arm)   Pulse 86   Temp 98.4 F (36.9 C) (Oral)   Resp 18   Ht 5\' 8"  (1.727 m)   Wt 114.3 kg (252 lb)   SpO2 95%   BMI 38.32 kg/m   Intake/Output Summary (Last 24 hours) at 08/28/16 1216 Last data filed at 08/28/16 1610  Gross per 24 hour  Intake             1786 ml  Output                0 ml  Net             1786 ml   Filed Weights   08/25/16 0534 08/27/16 0519 08/28/16 0501  Weight: 114.8 kg (253 lb) 114.3 kg (251 lb 14.4 oz) 114.3 kg (252 lb)    Exam: Patient is examined daily including today on 08/28/2016, exam remain the same as of yesterday except that is changed   General:  Obese, NAD, feeling better, less pain  Cardiovascular: RRR  Respiratory: CTABL  Abdomen: Soft/ND/NT, positive BS, left mons, left inguinal area, left labial less firm, less tender, +serosanguinous drainage  Musculoskeletal: No Edema  Neuro: aaox3  Data Reviewed: Basic Metabolic Panel:  Recent Labs Lab 08/24/16 0320 08/25/16 0409 08/26/16 0432 08/27/16 0231 08/28/16 0257  NA 137 136 137 137 138  K 4.2 3.2* 3.4* 3.6 3.6  CL 103 105 103 103 104  CO2 21* 25 25 30 25   GLUCOSE 242* 279* 199* 228* 164*  BUN 21* 14 9 7 6    CREATININE 1.00 0.82 0.87 0.80 0.94  CALCIUM 8.9 8.2* 8.2* 8.3* 8.6*  MG  --   --  1.7 1.7  --    Liver Function Tests:  Recent Labs Lab 08/26/16 1820  AST 13*  ALT 17  ALKPHOS 73  BILITOT 0.8  PROT 6.2*  ALBUMIN 1.8*   No results for input(s): LIPASE, AMYLASE in the last 168 hours. No results for input(s): AMMONIA in the last 168 hours. CBC:  Recent Labs Lab 08/21/16 2358  08/23/16 1027 08/24/16 0320 08/25/16 0409 08/26/16 0432 08/27/16 0231 08/28/16 0257  WBC 19.3*  < > 13.4* 18.8* 17.5* 18.8* 18.7* 21.4*  NEUTROABS 16.9*  --  9.5* 14.1*  --   --   --   --   HGB 12.8  < > 13.3 15.5* 12.4 12.5 12.2 12.5  HCT 39.3  < > 40.5 45.6 37.4 37.3 36.6 37.7  MCV 89.1  < > 89.4 91.2 89.9 88.4 89.1 89.3  PLT 250  < > 283 278 278 311 315 347  < > =  values in this interval not displayed. Cardiac Enzymes:    Recent Labs Lab 08/22/16 1814 08/22/16 2243 08/23/16 0515  TROPONINI 0.09* 0.06* <0.03   BNP (last 3 results)  Recent Labs  08/21/16 2358  BNP 419.0*    ProBNP (last 3 results) No results for input(s): PROBNP in the last 8760 hours.  CBG:  Recent Labs Lab 08/27/16 1224 08/27/16 1717 08/27/16 2119 08/28/16 0814 08/28/16 1210  GLUCAP 251* 220* 191* 163* 222*    Recent Results (from the past 240 hour(s))  Urine Culture     Status: None   Collection Time: 08/21/16 11:15 PM  Result Value Ref Range Status   Specimen Description URINE, RANDOM  Final   Special Requests NONE  Final   Culture NO GROWTH  Final   Report Status 08/23/2016 FINAL  Final  Culture, blood (Routine X 2) w Reflex to ID Panel     Status: None   Collection Time: 08/21/16 11:58 PM  Result Value Ref Range Status   Specimen Description BLOOD RIGHT HAND  Final   Special Requests   Final    BOTTLES DRAWN AEROBIC AND ANAEROBIC Blood Culture adequate volume   Culture NO GROWTH 5 DAYS  Final   Report Status 08/27/2016 FINAL  Final  Culture, blood (Routine X 2) w Reflex to ID Panel      Status: None   Collection Time: 08/22/16 12:08 AM  Result Value Ref Range Status   Specimen Description BLOOD LEFT HAND  Final   Special Requests   Final    BOTTLES DRAWN AEROBIC AND ANAEROBIC Blood Culture adequate volume   Culture NO GROWTH 5 DAYS  Final   Report Status 08/27/2016 FINAL  Final  MRSA PCR Screening     Status: None   Collection Time: 08/26/16 11:32 AM  Result Value Ref Range Status   MRSA by PCR NEGATIVE NEGATIVE Final    Comment:        The GeneXpert MRSA Assay (FDA approved for NASAL specimens only), is one component of a comprehensive MRSA colonization surveillance program. It is not intended to diagnose MRSA infection nor to guide or monitor treatment for MRSA infections.      Studies: No results found.  Scheduled Meds: . clotrimazole   Topical BID  . famotidine  20 mg Oral BID  . ferrous sulfate  325 mg Oral Q breakfast  . insulin aspart  0-5 Units Subcutaneous QHS  . insulin aspart  0-9 Units Subcutaneous TID WC  . insulin aspart protamine- aspart  14 Units Subcutaneous BID WC  . losartan  25 mg Oral Daily  . magnesium oxide  400 mg Oral Daily  . nicotine  21 mg Transdermal Daily  . polyethylene glycol  17 g Oral Daily  . pregabalin  100 mg Oral BID  . senna-docusate  1 tablet Oral BID  . traZODone  150 mg Oral QHS    Continuous Infusions: . heparin 2,400 Units/hr (08/28/16 0655)  . piperacillin-tazobactam (ZOSYN)  IV 3.375 g (08/28/16 0840)  . vancomycin Stopped (08/28/16 0940)      Costantino Kohlbeck MD, PhD  [facility] Pager [phone]. If 7PM-7AM, please contact night-coverage at [email or web address], password [email or web address] 08/28/2016, 12:16 PM  LOS: 7 days

## 2016-08-28 NOTE — Progress Notes (Signed)
Physical Therapy Treatment Patient Details Name: Hannah [name] MRN: [id number] DOB: [birth year] Today's Date: 08/28/2016    History of Present Illness Pt is a 56 y/o female admitted secondary to abscess and cellulitis in perineal and L axillar areas. PMH including but not limited to HTN, depression, DM, CHF and CKD.    PT Comments    Patient progressing with ambulation and able to negotiate stairs this pm.  Still painful and limited tolerance, but progressing with in room ambulation without walker.  Feel continued skilled PT in the acute setting indicated to progress to goals, but no current follow up PT needs.    Follow Up Recommendations  No PT follow up     Equipment Recommendations  Rolling walker with 5" wheels    Recommendations for Other Services       Precautions / Restrictions      Mobility  Bed Mobility         Supine to sit: Supervision;HOB elevated Sit to supine: Supervision   General bed mobility comments: increased time to sit up, but able to perform unaided  Transfers Overall transfer level: Needs assistance Equipment used: Rolling walker (2 wheeled) Transfers: Sit to/from Stand Sit to Stand: Supervision         General transfer comment: admits to getting up to bathroom on her own in room   Ambulation/Gait Ambulation/Gait assistance: Supervision Ambulation Distance (Feet): 350 Feet Assistive device: Rolling walker (2 wheeled) Gait Pattern/deviations: Step-through pattern;Decreased stride length;Trunk flexed     General Gait Details: holding walker little forward, but aware and attempting to correct   Stairs Stairs: Yes   Stair Management: Forwards;One rail Right;Step to pattern Number of Stairs: 3 General stair comments: cues for technique, reported more difficutly ascending  Wheelchair Mobility    Modified Rankin (Stroke Patients Only)       Balance Overall balance assessment: Needs assistance Sitting-balance support: Feet  supported Sitting balance-Leahy Scale: Good     Standing balance support: During functional activity;No upper extremity supported Standing balance-Leahy Scale: Good Standing balance comment: performing perineal hygiene in bathroom                            Cognition Arousal/Alertness: Awake/alert Behavior During Therapy: WFL for tasks assessed/performed Overall Cognitive Status: Within Functional Limits for tasks assessed                                        Exercises      General Comments        Pertinent Vitals/Pain Faces Pain Scale: Hurts whole lot Pain Location: perineal area Pain Descriptors / Indicators: Sore;Tender Pain Intervention(s): Monitored during session;Repositioned    Home Living                      Prior Function            PT Goals (current goals can now be found in the care plan section) Progress towards PT goals: Progressing toward goals    Frequency    Min 3X/week      PT Plan Current plan remains appropriate    Co-evaluation              AM-PAC PT "6 Clicks" Daily Activity  Outcome Measure  Difficulty turning over in bed (including adjusting bedclothes, sheets and blankets)?: None Difficulty moving from  lying on back to sitting on the side of the bed? : A Little Difficulty sitting down on and standing up from a chair with arms (e.g., wheelchair, bedside commode, etc,.)?: A Little Help needed moving to and from a bed to chair (including a wheelchair)?: A Little Help needed walking in hospital room?: A Little Help needed climbing 3-5 steps with a railing? : A Little 6 Click Score: 19    End of Session   Activity Tolerance: Patient tolerated treatment well Patient left: in bed;with call bell/phone within reach   PT Visit Diagnosis: Other abnormalities of gait and mobility (R26.89);Pain     Time: 1353-1417 PT Time Calculation (min) (ACUTE ONLY): 24 min  Charges:  $Gait Training:  8-22 mins $Therapeutic Activity: 8-22 mins                    G CodesSheran Lawless, [place] [phone] 08/28/2016    Elray Mcgregor 08/28/2016, 5:05 PM

## 2016-08-29 ENCOUNTER — Inpatient Hospital Stay (HOSPITAL_COMMUNITY): Payer: Medicaid Other | Admitting: Certified Registered Nurse Anesthetist

## 2016-08-29 ENCOUNTER — Encounter (HOSPITAL_COMMUNITY)
Admission: AD | Disposition: A | Discharge status: Home / Self Care | Payer: Self-pay | Source: Other Acute Inpatient Hospital | Attending: Internal Medicine

## 2016-08-29 ENCOUNTER — Ambulatory Visit (HOSPITAL_COMMUNITY): Admission: RE | Admit: 2016-08-29 | Payer: Self-pay | Source: Ambulatory Visit | Admitting: Family Medicine

## 2016-08-29 ENCOUNTER — Encounter (HOSPITAL_COMMUNITY): Payer: Self-pay | Admitting: *Deleted

## 2016-08-29 DIAGNOSIS — I825Y2 Chronic embolism and thrombosis of unspecified deep veins of left proximal lower extremity: Secondary | ICD-10-CM

## 2016-08-29 DIAGNOSIS — I5032 Chronic diastolic (congestive) heart failure: Secondary | ICD-10-CM

## 2016-08-29 DIAGNOSIS — I10 Essential (primary) hypertension: Secondary | ICD-10-CM

## 2016-08-29 DIAGNOSIS — Z30430 Encounter for insertion of intrauterine contraceptive device: Secondary | ICD-10-CM

## 2016-08-29 HISTORY — PX: VULVA /PERINEUM BIOPSY: SHX319

## 2016-08-29 LAB — SURGICAL PCR SCREEN
MRSA, PCR: NEGATIVE
Staphylococcus aureus: NEGATIVE

## 2016-08-29 LAB — BASIC METABOLIC PANEL
Anion gap: 9 (ref 5–15)
BUN: 6 mg/dL (ref 6–20)
CALCIUM: 8.5 mg/dL — AB (ref 8.9–10.3)
CO2: 25 mmol/L (ref 22–32)
CREATININE: 0.96 mg/dL (ref 0.44–1.00)
Chloride: 104 mmol/L (ref 101–111)
GFR calc Af Amer: >60 mL/min (ref >60)
GLUCOSE: 238 mg/dL — AB (ref 65–99)
Potassium: 3.5 mmol/L (ref 3.5–5.1)
Sodium: 138 mmol/L (ref 135–145)

## 2016-08-29 LAB — HEPARIN LEVEL (UNFRACTIONATED): HEPARIN UNFRACTIONATED: 0.44 [IU]/mL (ref 0.30–0.70)

## 2016-08-29 LAB — GLUCOSE, CAPILLARY
GLUCOSE-CAPILLARY: 176 mg/dL — AB (ref 65–99)
GLUCOSE-CAPILLARY: 146 mg/dL — AB (ref 65–99)
Glucose-Capillary: 194 mg/dL — ABNORMAL HIGH (ref 65–99)
Glucose-Capillary: 156 mg/dL — ABNORMAL HIGH (ref 65–99)
Glucose-Capillary: 148 mg/dL — ABNORMAL HIGH (ref 65–99)

## 2016-08-29 LAB — CBC
HCT: 35.8 % — ABNORMAL LOW (ref 36.0–46.0)
Hemoglobin: 11.8 g/dL — ABNORMAL LOW (ref 12.0–15.0)
MCH: 29.8 pg (ref 26.0–34.0)
MCHC: 33 g/dL (ref 30.0–36.0)
MCV: 90.4 fL (ref 78.0–100.0)
PLATELETS: 294 10*3/uL (ref 150–400)
RBC: 3.96 MIL/uL (ref 3.87–5.11)
RDW: 14 % (ref 11.5–15.5)
WBC: 15 10*3/uL — AB (ref 4.0–10.5)

## 2016-08-29 LAB — MAGNESIUM: Magnesium: 1.7 mg/dL (ref 1.7–2.4)

## 2016-08-29 SURGERY — BIOPSY, VULVA
Anesthesia: General

## 2016-08-29 MED ORDER — LACTATED RINGERS IV SOLN
INTRAVENOUS | Status: DC
  Administered 2016-08-29 (×2): via INTRAVENOUS

## 2016-08-29 MED ORDER — LACTATED RINGERS IV SOLN: INTRAVENOUS | Status: DC

## 2016-08-29 MED ORDER — LIDOCAINE HCL (CARDIAC) 20 MG/ML IV SOLN
INTRAVENOUS | Status: DC | PRN
  Administered 2016-08-29: 100 mg via INTRAVENOUS

## 2016-08-29 MED ORDER — FENTANYL CITRATE (PF) 250 MCG/5ML IJ SOLN
INTRAMUSCULAR | Status: AC
  Filled 2016-08-29: qty 5

## 2016-08-29 MED ORDER — MAGNESIUM SULFATE 2 GM/50ML IV SOLN
2.0000 g | Freq: Once | INTRAVENOUS | Status: AC
  Administered 2016-08-29: 2 g via INTRAVENOUS
  Filled 2016-08-29: qty 50

## 2016-08-29 MED ORDER — HYDROMORPHONE HCL 1 MG/ML IJ SOLN
INTRAMUSCULAR | Status: AC
  Filled 2016-08-29: qty 1

## 2016-08-29 MED ORDER — MIDAZOLAM HCL 5 MG/5ML IJ SOLN
INTRAMUSCULAR | Status: DC | PRN
  Administered 2016-08-29: 1 mg via INTRAVENOUS

## 2016-08-29 MED ORDER — PROPOFOL 10 MG/ML IV BOLUS
INTRAVENOUS | Status: DC | PRN
  Administered 2016-08-29: 160 mg via INTRAVENOUS

## 2016-08-29 MED ORDER — METOCLOPRAMIDE HCL 5 MG/ML IJ SOLN: 10.0000 mg | Freq: Once | INTRAMUSCULAR | Status: DC | PRN

## 2016-08-29 MED ORDER — FENTANYL CITRATE (PF) 100 MCG/2ML IJ SOLN
INTRAMUSCULAR | Status: DC | PRN
  Administered 2016-08-29: 50 ug via INTRAVENOUS
  Administered 2016-08-29 (×2): 25 ug via INTRAVENOUS
  Administered 2016-08-29: 50 ug via INTRAVENOUS

## 2016-08-29 MED ORDER — OXYCODONE HCL 5 MG/5ML PO SOLN: 5.0000 mg | Freq: Once | ORAL | Status: DC | PRN

## 2016-08-29 MED ORDER — FENTANYL CITRATE (PF) 100 MCG/2ML IJ SOLN
INTRAMUSCULAR | Status: AC
  Administered 2016-08-29: 50 ug via INTRAVENOUS
  Filled 2016-08-29: qty 2

## 2016-08-29 MED ORDER — FENTANYL CITRATE (PF) 100 MCG/2ML IJ SOLN
25.0000 ug | INTRAMUSCULAR | Status: DC | PRN
  Administered 2016-08-29 (×3): 50 ug via INTRAVENOUS

## 2016-08-29 MED ORDER — MIDAZOLAM HCL 2 MG/2ML IJ SOLN
INTRAMUSCULAR | Status: AC
  Filled 2016-08-29: qty 2

## 2016-08-29 MED ORDER — BUPIVACAINE HCL (PF) 0.5 % IJ SOLN
INTRAMUSCULAR | Status: AC
  Filled 2016-08-29: qty 30

## 2016-08-29 MED ORDER — ONDANSETRON HCL 4 MG/2ML IJ SOLN
INTRAMUSCULAR | Status: DC | PRN
  Administered 2016-08-29: 4 mg via INTRAVENOUS

## 2016-08-29 MED ORDER — PROPOFOL 10 MG/ML IV BOLUS
INTRAVENOUS | Status: AC
  Filled 2016-08-29: qty 20

## 2016-08-29 MED ORDER — LACTATED RINGERS IV SOLN
INTRAVENOUS | Status: DC | PRN
Start: 2016-08-29 — End: 2016-08-29
  Administered 2016-08-29: 13:00:00 via INTRAVENOUS

## 2016-08-29 MED ORDER — HEPARIN (PORCINE) IN NACL 100-0.45 UNIT/ML-% IJ SOLN
2350.0000 [IU]/h | INTRAMUSCULAR | Status: DC
  Administered 2016-08-30 (×2): 2400 [IU]/h via INTRAVENOUS
  Administered 2016-08-30 – 2016-09-02 (×7): 2350 [IU]/h via INTRAVENOUS
  Filled 2016-08-29 (×8): qty 250

## 2016-08-29 MED ORDER — HYDROMORPHONE HCL 1 MG/ML IJ SOLN
0.2500 mg | INTRAMUSCULAR | Status: DC | PRN
  Administered 2016-08-29 (×2): 0.5 mg via INTRAVENOUS

## 2016-08-29 MED ORDER — HYDROMORPHONE HCL 1 MG/ML IJ SOLN
INTRAMUSCULAR | Status: AC
  Administered 2016-08-29: 0.5 mg via INTRAVENOUS
  Filled 2016-08-29: qty 1

## 2016-08-29 MED ORDER — MEPERIDINE HCL 25 MG/ML IJ SOLN: 6.2500 mg | INTRAMUSCULAR | Status: DC | PRN

## 2016-08-29 MED ORDER — OXYCODONE HCL 5 MG PO TABS: 5.0000 mg | ORAL_TABLET | Freq: Once | ORAL | Status: DC | PRN

## 2016-08-29 SURGICAL SUPPLY — 24 items
BLADE SURG 15 STRL LF C SS BP (BLADE) ×1 IMPLANT
BLADE SURG 15 STRL SS (BLADE) ×2
CLOTH BEACON ORANGE TIMEOUT ST (SAFETY) ×3 IMPLANT
CONTAINER PREFILL 10% NBF 60ML (FORM) IMPLANT
COVER MAYO STAND STRL (DRAPES) ×3 IMPLANT
DILATOR CANAL MILEX (MISCELLANEOUS) ×3 IMPLANT
ELECT PENCIL ROCKER SW 15FT (MISCELLANEOUS) ×3 IMPLANT
ELECT REM PT RETURN 9FT ADLT (ELECTROSURGICAL) ×3
ELECTRODE REM PT RTRN 9FT ADLT (ELECTROSURGICAL) ×1 IMPLANT
GAUZE PACKING IODOFORM 1X5 (MISCELLANEOUS) ×3 IMPLANT
GLOVE BIOGEL PI IND STRL 7.0 (GLOVE) ×2 IMPLANT
GLOVE BIOGEL PI INDICATOR 7.0 (GLOVE) ×4
GLOVE ECLIPSE 7.0 STRL STRAW (GLOVE) ×3 IMPLANT
GOWN STRL REUS W/TWL LRG LVL3 (GOWN DISPOSABLE) ×3 IMPLANT
GOWN STRL REUS W/TWL XL LVL4 (GOWN DISPOSABLE) ×3 IMPLANT
LEGGING LITHOTOMY PAIR STRL (DRAPES) ×3 IMPLANT
LILETTA 52 MG ×3 IMPLANT
NEEDLE HYPO 25X1 1.5 SAFETY (NEEDLE) ×3 IMPLANT
PACK VAGINAL MINOR WOMEN LF (CUSTOM PROCEDURE TRAY) ×3 IMPLANT
PAD PREP 24X48 CUFFED NSTRL (MISCELLANEOUS) ×3 IMPLANT
SURGILUBE 2OZ TUBE FLIPTOP (MISCELLANEOUS) ×3 IMPLANT
SUT CHROMIC 3 0 SH 27 (SUTURE) IMPLANT
SYR CONTROL 10ML LL (SYRINGE) ×3 IMPLANT
TOWEL OR 17X24 6PK STRL BLUE (TOWEL DISPOSABLE) ×6 IMPLANT

## 2016-08-29 NOTE — Plan of Care (Signed)
Problem: Pain Managment: Goal: General experience of comfort will improve Outcome: Progressing Pt's pain will be at goal rating prior to discharge.  Problem: Activity: Goal: Risk for activity intolerance will decrease Outcome: Progressing Pt's tolerance level for activity will improve prior to discharge.

## 2016-08-29 NOTE — Anesthesia Procedure Notes (Signed)
Procedure Name: LMA Insertion Date/Time: 08/29/2016 1:43 PM Performed by: Virgel GessHOLTZMAN, Chayanne Filippi LEFFEW Pre-anesthesia Checklist: Patient identified, Emergency Drugs available, Suction available, Timeout performed and Patient being monitored Patient Re-evaluated:Patient Re-evaluated prior to induction Oxygen Delivery Method: Circle system utilized Preoxygenation: Pre-oxygenation with 100% oxygen Induction Type: IV induction Ventilation: Mask ventilation without difficulty LMA: LMA inserted LMA Size: 5.0 Number of attempts: 1 Tube secured with: Tape Dental Injury: Teeth and Oropharynx as per pre-operative assessment

## 2016-08-29 NOTE — Transfer of Care (Signed)
Immediate Anesthesia Transfer of Care Note  Patient: Hannah [name]  Procedure(s) Performed: Procedure(s) with comments: Irrigation and Debridement with Insertion of Interuterine Device (N/A) - Irrigation and Debridement with Insertion of Interuterine Device  Patient Location: PACU  Anesthesia Type:General  Level of Consciousness: awake, alert , oriented, patient cooperative and responds to stimulation  Airway & Oxygen Therapy: Patient Spontanous Breathing and Patient connected to face mask oxygen  Post-op Assessment: Report given to RN, Post -op Vital signs reviewed and stable and Patient moving all extremities X 4  Post vital signs: Reviewed and stable  Last Vitals:  Vitals:   08/28/16 2213 08/29/16 0627  BP: 129/60 139/67  Pulse: 74 64  Resp: 18 16  Temp: 36.7 C 36.8 C  SpO2: 96% 96%    Last Pain:  Vitals:   08/29/16 0940  TempSrc:   PainSc: 3       Patients Stated Pain Goal: 4 (08/29/16 0848)  Complications: No apparent anesthesia complications

## 2016-08-29 NOTE — Interval H&P Note (Signed)
History and Physical Interval Note:  08/29/2016 12:43 PM  Hannah [name]  has presented today for surgery, with the diagnosis of Vulvar Mass and endometrial hyperplasia. S/p 9 days of IV Abx. The various methods of treatment have been discussed with the patient and family. After consideration of risks, benefits and other options for treatment, the patient has consented to  Procedure(s): VULVAR BIOPSY (N/A), Incision and Drainage and IUD insertion as a surgical intervention .  The patient's history has been reviewed, patient examined, no change in status, stable for surgery.  I have reviewed the patient's chart and labs.  Questions were answered to the patient's satisfaction.     Reva Boresanya S Pratt

## 2016-08-29 NOTE — Anesthesia Preprocedure Evaluation (Signed)
Anesthesia Evaluation  Patient identified by MRN, date of birth, ID band Patient awake    Reviewed: Allergy & Precautions, NPO status , Patient's Chart, lab work & pertinent test results  Airway Mallampati: II  TM Distance: >3 FB Neck ROM: Full    Dental no notable dental hx. (+) Edentulous Upper, Edentulous Lower   Pulmonary asthma , Current Smoker,    Pulmonary exam normal breath sounds clear to auscultation       Cardiovascular hypertension, Pt. on medications Normal cardiovascular exam Rhythm:Regular Rate:Normal     Neuro/Psych negative neurological ROS  negative psych ROS   GI/Hepatic negative GI ROS, Neg liver ROS,   Endo/Other  diabetes, Poorly Controlled, Type 2  Renal/GU negative Renal ROS  negative genitourinary   Musculoskeletal negative musculoskeletal ROS (+)   Abdominal   Peds negative pediatric ROS (+)  Hematology negative hematology ROS (+)   Anesthesia Other Findings   Reproductive/Obstetrics negative OB ROS                             Anesthesia Physical Anesthesia Plan  ASA: III  Anesthesia Plan: General   Post-op Pain Management:    Induction: Intravenous  PONV Risk Score and Plan: 3 and Ondansetron, Dexamethasone, Midazolam and Treatment may vary due to age or medical condition  Airway Management Planned: LMA  Additional Equipment:   Intra-op Plan:   Post-operative Plan: Extubation in OR  Informed Consent: I have reviewed the patients History and Physical, chart, labs and discussed the procedure including the risks, benefits and alternatives for the proposed anesthesia with the patient or authorized representative who has indicated his/her understanding and acceptance.   Dental advisory given  Plan Discussed with: CRNA  Anesthesia Plan Comments:         Anesthesia Quick Evaluation

## 2016-08-29 NOTE — Progress Notes (Signed)
Lt ampit dressing changed

## 2016-08-29 NOTE — Progress Notes (Signed)
Okay to stop hep gtt per MD Netwon/Pratt with OB.

## 2016-08-29 NOTE — Progress Notes (Signed)
PROGRESS NOTE    Hannah [name]  ZOX:[id number] DOB: [birth year] DOA: 08/21/2016 PCP: Anselmo Pickler, MD   Brief Narrative: 56 year old female with HTN, DM2, depression, anxiety, recurrent DVT on Coumadin, dCHF, iron deficiency anemia, tobacco abuse, CKD, who presents with abscess and cellulitis in left perineal and left axillar areas. She was started on vanc/zosyn, heparin drip. Ob/gyl and gyn onc consulted, plan for I&D and biopsy today by Gyn.  Assessment & Plan:   # Labial abscess and perineal cellulitis, persistent leukocytosis -Blood cultures x 2 no growth, mrsa screening negative -plan for biopsy and I&D today by Gyn. -on Vanc/zosyn and clindamycin currently. Likely de-escalate antibiotics soon after the procedure.   # Abscess/cellulitis of left axillary area: Left axilla abscess spontaneously ruptured,  Wound care consulted, input appreciated  # Hypokalemia/ hypomagnesemia: Repleted magnesium sulfate today. Potassium level acceptable. Monitor labs.  # Diabetes mellitus with renal complications:previously not on insulin -Last A1c is 11.9. Patient is taking Amarylat home -holding am insulin because of nothing by mouth status. Monitor blood sugar level. Diabetic education.  #Hx of DVT, recurent:on coumadin  -Currently on heparin for surgical procedure.Need to resume Coumadin when okay from surgeon.  #Chronic diastolic CHF (congestive heart failure) (HCC): Continue to monitor. Euvolemic on exam.  # Hypertension: Blood pressure acceptable. Continue losartan.  # Iron deficiency anemia: -Continue iron supplement  #Tobacco abuse: -Nicotine patch -Counsel  Obesity: Body mass index is 38.32 kg/m.  DVT prophylaxis: heparin Code Status:full Family Communication: No family at bedside Disposition Plan: Likely discharge home in 1-2 days    Consultants:   GYN  Procedures: None Antimicrobials: Vancomycin and Zosyn since admission and clindamycin since  August 14.  Subjective: Seen and examined at bedside. Denied headache, dizziness, nausea vomiting, chest pain or shortness of breath.  Objective: Vitals:   08/28/16 1329 08/28/16 2213 08/29/16 0627 08/29/16 1236  BP: (!) 128/54 129/60 139/67   Pulse: 85 74 64   Resp: 18 18 16    Temp: 98.4 F (36.9 C) 98.1 F (36.7 C) 98.3 F (36.8 C)   TempSrc:  Oral Oral   SpO2: 94% 96% 96%   Weight:    114.3 kg (252 lb)  Height:    5\' 8"  (1.727 m)    Intake/Output Summary (Last 24 hours) at 08/29/16 1240 Last data filed at 08/28/16 2300  Gross per 24 hour  Intake           1208.4 ml  Output                0 ml  Net           1208.4 ml   Filed Weights   08/27/16 0519 08/28/16 0501 08/29/16 1236  Weight: 114.3 kg (251 lb 14.4 oz) 114.3 kg (252 lb) 114.3 kg (252 lb)    Examination:  General exam: Appears calm and comfortable  Respiratory system: Clear to auscultation. Respiratory effort normal. No wheezing or crackle Cardiovascular system: S1 & S2 heard, RRR.  No pedal edema. Gastrointestinal system: Abdomen is nondistended, soft and nontender. Normal bowel sounds heard. Central nervous system: Alert and oriented. No focal neurological deficits. Extremities: Symmetric 5 x 5 power. Skin: No rashes, lesions or ulcers Psychiatry: Judgement and insight appear normal. Mood & affect appropriate.     Data Reviewed: I have personally reviewed following labs and imaging studies  CBC:  Recent Labs Lab 08/23/16 1027 08/24/16 0320 08/25/16 0409 08/26/16 0432 08/27/16 0231 08/28/16 0257 08/29/16 0521  WBC 13.4* 18.8* 17.5*  18.8* 18.7* 21.4* 15.0*  NEUTROABS 9.5* 14.1*  --   --   --   --   --   HGB 13.3 15.5* 12.4 12.5 12.2 12.5 11.8*  HCT 40.5 45.6 37.4 37.3 36.6 37.7 35.8*  MCV 89.4 91.2 89.9 88.4 89.1 89.3 90.4  PLT 283 278 278 311 315 347 294   Basic Metabolic Panel:  Recent Labs Lab 08/25/16 0409 08/26/16 0432 08/27/16 0231 08/28/16 0257 08/29/16 0521  NA 136 137 137  138 138  K 3.2* 3.4* 3.6 3.6 3.5  CL 105 103 103 104 104  CO2 25 25 30 25 25   GLUCOSE 279* 199* 228* 164* 238*  BUN 14 9 7 6 6   CREATININE 0.82 0.87 0.80 0.94 0.96  CALCIUM 8.2* 8.2* 8.3* 8.6* 8.5*  MG  --  1.7 1.7  --  1.7   GFR: Estimated Creatinine Clearance: 84.8 mL/min (by C-G formula based on SCr of 0.96 mg/dL). Liver Function Tests:  Recent Labs Lab 08/26/16 1820  AST 13*  ALT 17  ALKPHOS 73  BILITOT 0.8  PROT 6.2*  ALBUMIN 1.8*   No results for input(s): LIPASE, AMYLASE in the last 168 hours. No results for input(s): AMMONIA in the last 168 hours. Coagulation Profile:  Recent Labs Lab 08/23/16 0515  INR 1.97   Cardiac Enzymes:  Recent Labs Lab 08/22/16 1814 08/22/16 2243 08/23/16 0515  TROPONINI 0.09* 0.06* <0.03   BNP (last 3 results) No results for input(s): PROBNP in the last 8760 hours. HbA1C: No results for input(s): HGBA1C in the last 72 hours. CBG:  Recent Labs Lab 08/28/16 1210 08/28/16 1702 08/28/16 2203 08/29/16 0816 08/29/16 1216  GLUCAP 222* 207* 198* 194* 176*   Lipid Profile: No results for input(s): CHOL, HDL, LDLCALC, TRIG, CHOLHDL, LDLDIRECT in the last 72 hours. Thyroid Function Tests: No results for input(s): TSH, T4TOTAL, FREET4, T3FREE, THYROIDAB in the last 72 hours. Anemia Panel: No results for input(s): VITAMINB12, FOLATE, FERRITIN, TIBC, IRON, RETICCTPCT in the last 72 hours. Sepsis Labs:  Recent Labs Lab 08/27/16 0231  LATICACIDVEN 1.0    Recent Results (from the past 240 hour(s))  Urine Culture     Status: None   Collection Time: 08/21/16 11:15 PM  Result Value Ref Range Status   Specimen Description URINE, RANDOM  Final   Special Requests NONE  Final   Culture NO GROWTH  Final   Report Status 08/23/2016 FINAL  Final  Culture, blood (Routine X 2) w Reflex to ID Panel     Status: None   Collection Time: 08/21/16 11:58 PM  Result Value Ref Range Status   Specimen Description BLOOD RIGHT HAND  Final    Special Requests   Final    BOTTLES DRAWN AEROBIC AND ANAEROBIC Blood Culture adequate volume   Culture NO GROWTH 5 DAYS  Final   Report Status 08/27/2016 FINAL  Final  Culture, blood (Routine X 2) w Reflex to ID Panel     Status: None   Collection Time: 08/22/16 12:08 AM  Result Value Ref Range Status   Specimen Description BLOOD LEFT HAND  Final   Special Requests   Final    BOTTLES DRAWN AEROBIC AND ANAEROBIC Blood Culture adequate volume   Culture NO GROWTH 5 DAYS  Final   Report Status 08/27/2016 FINAL  Final  MRSA PCR Screening     Status: None   Collection Time: 08/26/16 11:32 AM  Result Value Ref Range Status   MRSA by PCR NEGATIVE NEGATIVE Final  Comment:        The GeneXpert MRSA Assay (FDA approved for NASAL specimens only), is one component of a comprehensive MRSA colonization surveillance program. It is not intended to diagnose MRSA infection nor to guide or monitor treatment for MRSA infections.   Aerobic/Anaerobic Culture (surgical/deep wound)     Status: None (Preliminary result)   Collection Time: 08/28/16  1:25 PM  Result Value Ref Range Status   Specimen Description ABSCESS LEFT GROIN  Final   Special Requests NONE  Final   Gram Stain   Final    RARE WBC PRESENT,BOTH PMN AND MONONUCLEAR NO ORGANISMS SEEN    Culture PENDING  Incomplete   Report Status PENDING  Incomplete  Surgical pcr screen     Status: None   Collection Time: 08/29/16  3:58 AM  Result Value Ref Range Status   MRSA, PCR NEGATIVE NEGATIVE Final   Staphylococcus aureus NEGATIVE NEGATIVE Final    Comment:        The Xpert SA Assay (FDA approved for NASAL specimens in patients over 58 years of age), is one component of a comprehensive surveillance program.  Test performance has been validated by [facility] for patients greater than or equal to 58 year old. It is not intended to diagnose infection nor to guide or monitor treatment.          Radiology Studies: No results  found.      Scheduled Meds: . [MAR Hold] clotrimazole   Topical BID  . [MAR Hold] famotidine  20 mg Oral BID  . [MAR Hold] ferrous sulfate  325 mg Oral Q breakfast  . [MAR Hold] insulin aspart  0-5 Units Subcutaneous QHS  . [MAR Hold] insulin aspart  0-9 Units Subcutaneous TID WC  . [MAR Hold] insulin aspart  3 Units Subcutaneous TID WC  . [MAR Hold] insulin aspart protamine- aspart  14 Units Subcutaneous BID WC  . [MAR Hold] losartan  25 mg Oral Daily  . [MAR Hold] nicotine  21 mg Transdermal Daily  . [MAR Hold] polyethylene glycol  17 g Oral Daily  . [MAR Hold] pregabalin  100 mg Oral BID  . [MAR Hold] senna-docusate  1 tablet Oral BID  . [MAR Hold] traZODone  150 mg Oral QHS   Continuous Infusions: . [MAR Hold] clindamycin (CLEOCIN) IV 600 mg (08/29/16 0615)  . heparin 2,400 Units/hr (08/29/16 16100632)  . lactated ringers    . lactated ringers 10 mL/hr at 08/29/16 1238  . [MAR Hold] piperacillin-tazobactam (ZOSYN)  IV Stopped (08/29/16 0437)  . [MAR Hold] vancomycin 1,000 mg (08/29/16 0848)     LOS: 8 days    Arelene Moroni Jaynie CollinsPrasad Lashawndra Lampkins, MD [facility] Pager [phone]  If 7PM-7AM, please contact night-coverage [email or web address] Password [email or web address] 08/29/2016, 12:40 PM

## 2016-08-29 NOTE — OR Nursing (Signed)
In and Out catheter: output of urine 700 mL at 1353

## 2016-08-29 NOTE — Op Note (Signed)
Preoperative diagnosis: Vulvar lesion and probable abscess, endometrial hyperplasia  Postoperative diagnosis: Same  Procedure: Incision and drainage of left vulvar lesion, vulvar biopsy and Liletta IUD insertion  Surgeon: Standley Dakins. Kennon Rounds, M.D.  Anesthesia: MAC Montez Hageman, MD  Findings: spontaneous drainage, large amount of bloody purulent fluid removed, large defect superiorly and inferiorly, large edematous labia minora on left and continued large edema/induration of mons on left.  EBL: 150 cc  Reason for procedure: Patient is a 56 y.o.  who has a chronically inflamed area on mons that has had 9 days of IV abx and no significant improvement. Also has h/o DVT and endometrial hyperplasia and needs treatment.  Procedure: Patient was placed in dorsal lithotomy after spinal analgesia. She was prepped and draped in the usual sterile fashion.A  catheter was used to drain her bladder of almost 1 liter of clear urine. A timeout was performed. 5 cc of 0.5% Marcaine was injected about the lesion.  A linear incision was made in the previous site of drainage. Purulent fluid removed and necrotic tissue. A tonsil was used to break up adhesions and allow drainage. Large defects noted as above. Further area of induration was noted as above. Several small arterial pumpers were cauterized in the incision. Area packed with 1 inch iodoform. After injection with 1 cc of local a punch biopsy obtained. Hemostasis with electrocautery. Attention turned to the vagina. Speculum placed in the vagina.  Cervix visualized.  Grasped anteriourly with a single tooth tenaculum.  Uterus sounded to 8 cm.  Liletta IUD placed per manufacturer's recommendations.  Strings trimmed to 3 cm. All instrument, needle and sponge counts were correct x 2. Patient awakened and taken to recovery in stable condition.  Donnamae Jude MD 08/29/2016 2:24 PM

## 2016-08-30 ENCOUNTER — Encounter (HOSPITAL_COMMUNITY): Payer: Self-pay | Admitting: Family Medicine

## 2016-08-30 DIAGNOSIS — E669 Obesity, unspecified: Secondary | ICD-10-CM

## 2016-08-30 DIAGNOSIS — E1165 Type 2 diabetes mellitus with hyperglycemia: Secondary | ICD-10-CM

## 2016-08-30 DIAGNOSIS — Z872 Personal history of diseases of the skin and subcutaneous tissue: Secondary | ICD-10-CM

## 2016-08-30 LAB — BASIC METABOLIC PANEL
Anion gap: 9 (ref 5–15)
BUN: 6 mg/dL (ref 6–20)
CALCIUM: 8.3 mg/dL — AB (ref 8.9–10.3)
CHLORIDE: 105 mmol/L (ref 101–111)
CO2: 25 mmol/L (ref 22–32)
CREATININE: 1.05 mg/dL — AB (ref 0.44–1.00)
GFR calc non Af Amer: 57 mL/min — ABNORMAL LOW (ref >60)
Glucose, Bld: 99 mg/dL (ref 65–99)
Potassium: 4 mmol/L (ref 3.5–5.1)
SODIUM: 139 mmol/L (ref 135–145)

## 2016-08-30 LAB — CBC WITH DIFFERENTIAL/PLATELET
Basophils Absolute: 0.1 10*3/uL (ref 0.0–0.1)
Basophils Relative: 0 %
EOS ABS: 0.5 10*3/uL (ref 0.0–0.7)
EOS PCT: 3 %
HCT: 34.6 % — ABNORMAL LOW (ref 36.0–46.0)
Hemoglobin: 11.2 g/dL — ABNORMAL LOW (ref 12.0–15.0)
LYMPHS ABS: 3.8 10*3/uL (ref 0.7–4.0)
LYMPHS PCT: 26 %
MCH: 29.6 pg (ref 26.0–34.0)
MCHC: 32.4 g/dL (ref 30.0–36.0)
MCV: 91.5 fL (ref 78.0–100.0)
MONO ABS: 1.2 10*3/uL — AB (ref 0.1–1.0)
Monocytes Relative: 8 %
Neutro Abs: 9.2 10*3/uL — ABNORMAL HIGH (ref 1.7–7.7)
Neutrophils Relative %: 63 %
PLATELETS: 310 10*3/uL (ref 150–400)
RBC: 3.78 MIL/uL — ABNORMAL LOW (ref 3.87–5.11)
RDW: 14.4 % (ref 11.5–15.5)
WBC: 14.9 10*3/uL — ABNORMAL HIGH (ref 4.0–10.5)

## 2016-08-30 LAB — PROTIME-INR
INR: 1.03
Prothrombin Time: 13.6 seconds (ref 11.4–15.2)

## 2016-08-30 LAB — GLUCOSE, CAPILLARY
GLUCOSE-CAPILLARY: 154 mg/dL — AB (ref 65–99)
GLUCOSE-CAPILLARY: 184 mg/dL — AB (ref 65–99)
Glucose-Capillary: 102 mg/dL — ABNORMAL HIGH (ref 65–99)
Glucose-Capillary: 194 mg/dL — ABNORMAL HIGH (ref 65–99)

## 2016-08-30 LAB — HEPARIN LEVEL (UNFRACTIONATED): Heparin Unfractionated: 0.63 IU/mL (ref 0.30–0.70)

## 2016-08-30 LAB — MAGNESIUM: Magnesium: 2.1 mg/dL (ref 1.7–2.4)

## 2016-08-30 MED ORDER — WARFARIN - PHARMACIST DOSING INPATIENT: Freq: Every day | Status: DC

## 2016-08-30 MED ORDER — WARFARIN SODIUM 5 MG PO TABS
10.0000 mg | ORAL_TABLET | Freq: Once | ORAL | Status: AC
  Administered 2016-08-30: 10 mg via ORAL
  Filled 2016-08-30: qty 2

## 2016-08-30 NOTE — Progress Notes (Signed)
Dressing changed, pt washed down and pad changed

## 2016-08-30 NOTE — Progress Notes (Signed)
PROGRESS NOTE    Hannah [name]  NUU:[id number] DOB: 1960/02/10 DOA: 08/21/2016 PCP: Anselmo Pickler, MD   Brief Narrative: 56 year old female with HTN, DM2, depression, anxiety, recurrent DVT on Coumadin, dCHF, iron deficiency anemia, tobacco abuse, CKD, who presents with abscess and cellulitis in left perineal and left axillar areas. She was started on vanc/zosyn, heparin drip. Ob/gyl and gyn onc consulted, plan for I&D and biopsy today by Gyn.  Assessment & Plan:   # Labial abscess and perineal cellulitis, persistent leukocytosis -Blood cultures x 2 no growth, mrsa screening negative -s/p biopsy and I&D of vular abscess on 8/15.  -on Vanc/zosyn for about 10 days and clindamycin currently. Likely can de-escalate antibiotics, consulted ID and discussed with Orvan Falconer. -Follow-up GYN for further wound care and plan.  # Abscess/cellulitis of left axillary area: Left axilla abscess spontaneously ruptured,  Wound care consulted, input appreciated  # Hypokalemia/ hypomagnesemia: improved. Monitor labs.  # Diabetes mellitus with renal complications:previously not on insulin -Last A1c is 11.9. Patient is taking Amarylat home -Continue current insulin regimen. Monitor blood sugar level.  #Hx of DVT, recurent:on coumadin  -Coumadin was on hold because of surgical procedure. Plan to resume Coumadin today. Pharmacy consult. On IV heparin. No sign of bleeding.  #Chronic diastolic CHF (congestive heart failure) (HCC): Continue to monitor. Euvolemic on exam.  # Hypertension: Blood pressure acceptable. Continue losartan.  # Iron deficiency anemia: -Continue iron supplement  #Tobacco abuse: -Nicotine patch -Counsel  Obesity: Body mass index is 38.32 kg/m.  DVT prophylaxis: heparin Code Status:full Family Communication: No family at bedside Disposition Plan: Likely discharge home in 1-2 days    Consultants:   GYN  Infectious disease.  Procedures:  None Antimicrobials: Vancomycin and Zosyn since admission and clindamycin since August 14.  Subjective: Seen and examined at bedside. Denied headache, dizziness, chest pain shortness of breath. Objective: Vitals:   08/29/16 1600 08/29/16 1639 08/29/16 2320 08/30/16 0452  BP: (!) 160/67 (!) 147/68 128/60 (!) 112/58  Pulse: 88 86 75 75  Resp: 14 16 18 17   Temp: 97.7 F (36.5 C) 98.1 F (36.7 C) 97.9 F (36.6 C) 98.6 F (37 C)  TempSrc:  Oral Oral Oral  SpO2: 99% 98% 96% 94%  Weight:    114 kg (251 lb 4.8 oz)  Height:        Intake/Output Summary (Last 24 hours) at 08/30/16 1317 Last data filed at 08/30/16 1135  Gross per 24 hour  Intake           1559.2 ml  Output             1450 ml  Net            109.2 ml   Filed Weights   08/28/16 0501 08/29/16 1236 08/30/16 0452  Weight: 114.3 kg (252 lb) 114.3 kg (252 lb) 114 kg (251 lb 4.8 oz)    Examination:  General exam: Not in distress Respiratory system: Clear bilateral. Respiratory effort normal. No wheezing or crackle Cardiovascular system: Regular rate rhythm, S1 and S2 normal. No pedal edema. Gastrointestinal system: Abdomen soft, nontender. Bowel sound positive. Central nervous system: Alert and oriented. No focal neurological deficits. Extremities: Symmetric 5 x 5 power. Skin: No rashes, lesions or ulcers Psychiatry: Judgement and insight appear normal. Mood & affect appropriate.     Data Reviewed: I have personally reviewed following labs and imaging studies  CBC:  Recent Labs Lab 08/24/16 0320  08/26/16 0432 08/27/16 0231 08/28/16 0257 08/29/16 0521 08/30/16  0404  WBC 18.8*  < > 18.8* 18.7* 21.4* 15.0* 14.9*  NEUTROABS 14.1*  --   --   --   --   --  9.2*  HGB 15.5*  < > 12.5 12.2 12.5 11.8* 11.2*  HCT 45.6  < > 37.3 36.6 37.7 35.8* 34.6*  MCV 91.2  < > 88.4 89.1 89.3 90.4 91.5  PLT 278  < > 311 315 347 294 310  < > = values in this interval not displayed. Basic Metabolic Panel:  Recent Labs Lab  08/26/16 0432 08/27/16 0231 08/28/16 0257 08/29/16 0521 08/30/16 0404  NA 137 137 138 138 139  K 3.4* 3.6 3.6 3.5 4.0  CL 103 103 104 104 105  CO2 25 30 25 25 25   GLUCOSE 199* 228* 164* 238* 99  BUN 9 7 6 6 6   CREATININE 0.87 0.80 0.94 0.96 1.05*  CALCIUM 8.2* 8.3* 8.6* 8.5* 8.3*  MG 1.7 1.7  --  1.7 2.1   GFR: Estimated Creatinine Clearance: 77.4 mL/min (A) (by C-G formula based on SCr of 1.05 mg/dL (H)). Liver Function Tests:  Recent Labs Lab 08/26/16 1820  AST 13*  ALT 17  ALKPHOS 73  BILITOT 0.8  PROT 6.2*  ALBUMIN 1.8*   No results for input(s): LIPASE, AMYLASE in the last 168 hours. No results for input(s): AMMONIA in the last 168 hours. Coagulation Profile: No results for input(s): INR, PROTIME in the last 168 hours. Cardiac Enzymes: No results for input(s): CKTOTAL, CKMB, CKMBINDEX, TROPONINI in the last 168 hours. BNP (last 3 results) No results for input(s): PROBNP in the last 8760 hours. HbA1C: No results for input(s): HGBA1C in the last 72 hours. CBG:  Recent Labs Lab 08/29/16 1438 08/29/16 1641 08/29/16 2323 08/30/16 0805 08/30/16 1252  GLUCAP 146* 156* 148* 102* 154*   Lipid Profile: No results for input(s): CHOL, HDL, LDLCALC, TRIG, CHOLHDL, LDLDIRECT in the last 72 hours. Thyroid Function Tests: No results for input(s): TSH, T4TOTAL, FREET4, T3FREE, THYROIDAB in the last 72 hours. Anemia Panel: No results for input(s): VITAMINB12, FOLATE, FERRITIN, TIBC, IRON, RETICCTPCT in the last 72 hours. Sepsis Labs:  Recent Labs Lab 08/27/16 0231  LATICACIDVEN 1.0    Recent Results (from the past 240 hour(s))  Urine Culture     Status: None   Collection Time: 08/21/16 11:15 PM  Result Value Ref Range Status   Specimen Description URINE, RANDOM  Final   Special Requests NONE  Final   Culture NO GROWTH  Final   Report Status 08/23/2016 FINAL  Final  Culture, blood (Routine X 2) w Reflex to ID Panel     Status: None   Collection Time:  08/21/16 11:58 PM  Result Value Ref Range Status   Specimen Description BLOOD RIGHT HAND  Final   Special Requests   Final    BOTTLES DRAWN AEROBIC AND ANAEROBIC Blood Culture adequate volume   Culture NO GROWTH 5 DAYS  Final   Report Status 08/27/2016 FINAL  Final  Culture, blood (Routine X 2) w Reflex to ID Panel     Status: None   Collection Time: 08/22/16 12:08 AM  Result Value Ref Range Status   Specimen Description BLOOD LEFT HAND  Final   Special Requests   Final    BOTTLES DRAWN AEROBIC AND ANAEROBIC Blood Culture adequate volume   Culture NO GROWTH 5 DAYS  Final   Report Status 08/27/2016 FINAL  Final  MRSA PCR Screening     Status: None  Collection Time: 08/26/16 11:32 AM  Result Value Ref Range Status   MRSA by PCR NEGATIVE NEGATIVE Final    Comment:        The GeneXpert MRSA Assay (FDA approved for NASAL specimens only), is one component of a comprehensive MRSA colonization surveillance program. It is not intended to diagnose MRSA infection nor to guide or monitor treatment for MRSA infections.   Aerobic/Anaerobic Culture (surgical/deep wound)     Status: None (Preliminary result)   Collection Time: 08/28/16  1:25 PM  Result Value Ref Range Status   Specimen Description ABSCESS LEFT GROIN  Final   Special Requests NONE  Final   Gram Stain   Final    RARE WBC PRESENT,BOTH PMN AND MONONUCLEAR NO ORGANISMS SEEN    Culture NO GROWTH 1 DAY  Final   Report Status PENDING  Incomplete  Surgical pcr screen     Status: None   Collection Time: 08/29/16  3:58 AM  Result Value Ref Range Status   MRSA, PCR NEGATIVE NEGATIVE Final   Staphylococcus aureus NEGATIVE NEGATIVE Final    Comment:        The Xpert SA Assay (FDA approved for NASAL specimens in patients over 58 years of age), is one component of a comprehensive surveillance program.  Test performance has been validated by [facility] for patients greater than or equal to 58 year old. It is not intended to  diagnose infection nor to guide or monitor treatment.          Radiology Studies: No results found.      Scheduled Meds: . clotrimazole   Topical BID  . famotidine  20 mg Oral BID  . ferrous sulfate  325 mg Oral Q breakfast  . insulin aspart  0-5 Units Subcutaneous QHS  . insulin aspart  0-9 Units Subcutaneous TID WC  . insulin aspart  3 Units Subcutaneous TID WC  . insulin aspart protamine- aspart  14 Units Subcutaneous BID WC  . losartan  25 mg Oral Daily  . nicotine  21 mg Transdermal Daily  . polyethylene glycol  17 g Oral Daily  . pregabalin  100 mg Oral BID  . senna-docusate  1 tablet Oral BID  . traZODone  150 mg Oral QHS   Continuous Infusions: . clindamycin (CLEOCIN) IV Stopped (08/30/16 0622)  . heparin 2,350 Units/hr (08/30/16 1049)  . lactated ringers 10 mL/hr at 08/29/16 1909  . piperacillin-tazobactam (ZOSYN)  IV Stopped (08/30/16 1314)  . vancomycin Stopped (08/30/16 0925)     LOS: 9 days    Heaven Wandell Jaynie CollinsPrasad Iker Nuttall, MD [facility] Pager [phone]  If 7PM-7AM, please contact night-coverage [email or web address] Password [email or web address] 08/30/2016, 1:17 PM

## 2016-08-30 NOTE — Anesthesia Postprocedure Evaluation (Signed)
Anesthesia Post Note  Patient: Hannah [name]  Procedure(s) Performed: Procedure(s) (LRB): Irrigation and Debridement with Insertion of Interuterine Device (N/A)     Patient location during evaluation: PACU Anesthesia Type: General Level of consciousness: awake and alert Pain management: pain level controlled Vital Signs Assessment: post-procedure vital signs reviewed and stable Respiratory status: spontaneous breathing, nonlabored ventilation, respiratory function stable and patient connected to nasal cannula oxygen Cardiovascular status: blood pressure returned to baseline and stable Postop Assessment: no signs of nausea or vomiting Anesthetic complications: no    Last Vitals:  Vitals:   08/29/16 2320 08/30/16 0452  BP: 128/60 (!) 112/58  Pulse: 75 75  Resp: 18 17  Temp: 36.6 C 37 C  SpO2: 96% 94%    Last Pain:  Vitals:   08/30/16 0810  TempSrc:   PainSc: 3                  Phillips Groutarignan, Chasity Outten

## 2016-08-30 NOTE — Consult Note (Signed)
         [facility]    Date of Admission:  08/21/2016   Total days of antibiotics 10         Ms. Hannah [name] is a 56 year old with poorly controlled diabetes, obesity and history of recurrent boils and abscesses. Recently started on an antibiotic for a left axillary boil. She then developed painful swelling of her left perineum and went to [facility] 08/19/[facility]. She was transferred here and started on broad empiric antibiotic therapy with vancomycin and piperacillin tazobactam. She was slow to improve and then had spontaneous drainage began. She underwent incision and drainage of a labial abscess yesterday. No organisms were seen on Gram stain but cultures are growing yeast today. I hold [facility] to see if they had any culture data from her recent admission or previous visits to the emergency department for boils but could not reach anyone in the microbiology department. I will try again tomorrow. She may have recurrent staph or strep boils but is also at risk for mixed aerobic anaerobic infection in that location. I do not think that the yeast is likely to be pathogenic and would not treat it. She does not need both clindamycin and piperacillin tazobactam. I will stop clindamycin and continue vancomycin and piperacillin tazobactam pending final cultures. I will follow-up tomorrow. I discussed her care with medical student Will Antionette PolesSchreiner will leave a full consult note tonight.          Cliffton AstersJohn Ascension Stfleur, MD [facility] [phone] pager   [phone] 08/30/2016, 5:15 PM

## 2016-08-30 NOTE — Progress Notes (Signed)
Pt did not pee the whole of the twelve hour shift bladder scan done no urine retaining

## 2016-08-30 NOTE — Consult Note (Signed)
[facility]   Reason for Consult: Antibiotic therapy for perineal cellulitis with labial abscess Referring Physician: Dr. Ronalee BeltsBhandari  Principal Problem:   Labial abscess Active Problems:   Iron deficiency anemia   Essential hypertension   DVT (deep venous thrombosis) (HCC)   Diabetes mellitus with renal complications (HCC)   Cellulitis, perineum   Abscess of left axilla   Cellulitis of axilla, left   Chronic diastolic CHF (congestive heart failure) (HCC)   Supratherapeutic INR   CKD (chronic kidney disease), stage III   . clotrimazole   Topical BID  . famotidine  20 mg Oral BID  . ferrous sulfate  325 mg Oral Q breakfast  . insulin aspart  0-5 Units Subcutaneous QHS  . insulin aspart  0-9 Units Subcutaneous TID WC  . insulin aspart  3 Units Subcutaneous TID WC  . insulin aspart protamine- aspart  14 Units Subcutaneous BID WC  . losartan  25 mg Oral Daily  . nicotine  21 mg Transdermal Daily  . polyethylene glycol  17 g Oral Daily  . pregabalin  100 mg Oral BID  . senna-docusate  1 tablet Oral BID  . traZODone  150 mg Oral QHS  . warfarin  10 mg Oral ONCE-1800  . Warfarin - Pharmacist Dosing Inpatient   Does not apply q1800   Recommendations: - Continue IV vancomycin and Zosyn - Discontinue Clindamycin - Follow up cultures - [facility] for additional culture information - Low suspicion that yeast grown in culture is pathogenic. There is also low suspicion for presence of pseudomonal infection at this point, but will continue with Zosyn until we know more information  Thank you for this consult. We will continue to follow to provide recommendations for antibiotic selection and duration of therapy. Please contact us if you have questions.   Assessment: This 56 yo female with a history of poorly controlled DM, obesity, HTN, DVT on warfarin, CHF, and CKD stage 2 who presents with labial abscess. She has been receiving broad  spectrum antibiotics with Zosyn and vancoymycin for 10 days. Now s/p I&D of abscess (8/15), Gram stain of abscess showed rare WBC and no organisms. Culture has grown few yeast to date, however this might be contaminant, will follow results. Blood cultures on intake after antibiotic initiation show no growth. The patient associates formation of these abscesses in areas following vigorous scratching to relieve likely tinea cruris. Her uncontrolled diabetes is likely making her more prone to developing these abscesses. These recurrent abscesses could house staphylococcus or streptococcus, but given the location and presence of spontaneous drainage, there is concern for possible mixed aerobic and anaerobic infection. These can be covered by vancomycin and Zosyn for now, will follow culture data and [facility] for any additional information to help guide treatment. Clindamycin can be discontinued as Zosyn provides sufficient anaerobic coverage.  Antibiotics: Vancomycin (8/6 - ) Zosyn (8/6 - ) Clindamycin (8/14 - )  HPI: Hannah [name] is a 56 y.o. female with a history of poorly controlled DM, HTN, DVT on warfarin, CHF, and CKD stage 2 who presents with labial and axillary abscesses. She initially [facility]/6 for 1 week onset of labial abscess, 2 week onset of L axillary abscess, and fever to 102 with chills and pain at abscess site. Did not have other systemic symptoms at that time, only dysuria. She was started on vancomycin and Zosyn, INR was checked and found  to be 33 but had no obvious bleeding. She received 3 Units FFP, INR improved to 3.5. She was noted to have developed rash with Lasix while there.   She arrived at [facility] and was found to have WBC 19.3, INR 2.7, labwork otherwise unremarkable. She was afebrile and hemodynamically stable. She was continued on vancomycin and zosyn. I&D was initially deferred due to patient's supratherapeutic INR. Patient has  had leukocytosis during admission with slow downtrend, it is at 14.9 today. She has been afebrile with stable fever curve during admission. Blood and urine cultures on intake show now growth, however they were obtained after antibiotics were initiated.   She had CT pelvis on 8/10 that showed worsening cellulitis  at L labia compared to study done at [facility], no discrete abscess was visualized. She had spontaneous rupture of L axillary abscess by 8/11 and began to have spontaneous drainage of her labial abscess. She was evaluated by GYN Onc on 8/14, who recommended proceeding with I&D due to this drainage. They also recommended starting IV clindamycin.  I&D was performed on 8/15 with cultures obtained from abscess. They found large amount of bloody purulent fluid that was removed along with extensive edema present. Gm stain showed rare WBC with PMNs and mononuclear cells, no organisms seen. Cultures showing few yeast, still pending.  The patient reports having around 2 fluid collections form per year, usually proximal to her perineum but has had some in her axillae. Her last one was in November 2017. She usually self-manages these, but if they are slow to resolve she presents to her PCP who may prescribe a short course of oral antibiotics. She reports requiring hospitalization for abscess once before, which required incision and drainage but no course of antibiotics. She correlates these abscesses to regions where she experiences "jock itch". She does not have any pets. No recent travel or time outdoors. No rashes. No sick contacts. The patient states that her pain at abscess site has improved during admission. She reports having some serosanguinous drainage from site. She has some fatigue but continues to deny chills, abdominal pain, headache, dizziness, and other systemic symptoms.  Review of Systems: Review of Systems  Constitutional: Positive for malaise/fatigue. Negative for chills, diaphoresis and  fever.  HENT: Negative for congestion.   Respiratory: Negative for cough and shortness of breath.   Cardiovascular: Negative for chest pain and palpitations.  Gastrointestinal: Negative for abdominal pain, diarrhea, nausea and vomiting.  Genitourinary: Negative for hematuria.       Pain at L labial abscess  Musculoskeletal: Negative for back pain.  Skin: Negative for itching and rash.  Neurological: Negative for dizziness and headaches.      Past Medical History:  Diagnosis Date  . Chronic diastolic (congestive) heart failure (HCC)   . CKD (chronic kidney disease), stage III   . Diabetes mellitus without complication (HCC)   . DVT (deep venous thrombosis) (HCC)   . Essential hypertension   . Iron deficiency anemia     Social History  Substance Use Topics  . Smoking status: Current Every Day Smoker    Packs/day: 1.50    Years: 15.00    Types: Cigarettes  . Smokeless tobacco: Never Used  . Alcohol use No    Family History  Problem Relation Age of Onset  . Hypertension Mother   . Diabetes Mellitus II Father   . Kidney disease Father     Allergies  Allergen Reactions  . Naproxen Other (See Comments)    Emotional  Reaction Cramps    Physical Exam: Vitals:   08/30/16 0452 08/30/16 1510  BP: (!) 112/58 124/67  Pulse: 75 76  Resp: 17 18  Temp: 98.6 F (37 C) 97.6 F (36.4 C)  SpO2: 94% 98%   Physical Exam  Constitutional: She is oriented to person, place, and time.  Lying in bed, appears older than stated age, no acute distress, conversant, pleasant  HENT:  Head: Normocephalic.  Mouth/Throat: Oropharynx is clear and moist. No oropharyngeal exudate.  Eyes: Conjunctivae and EOM are normal. Right eye exhibits no discharge. Left eye exhibits no discharge. No scleral icterus.  Neck: Normal range of motion. Neck supple.  Cardiovascular: Normal rate, regular rhythm and normal heart sounds.  Exam reveals no gallop and no friction rub.   No murmur  heard. Pulmonary/Chest: Effort normal and breath sounds normal. No respiratory distress. She has no wheezes. She has no rales.  Abdominal: Soft. Bowel sounds are normal. She exhibits no distension. There is no tenderness.  Genitourinary:  Genitourinary Comments: Area of suprapubic edema on R that is non erythematous and without lesions. Gauze applied at labia, small amount of drainage on dressing noted.  Musculoskeletal: She exhibits no tenderness or deformity.  Trace edema in lower extremities bilaterally  Lymphadenopathy:    She has no cervical adenopathy.  Neurological: She is alert and oriented to person, place, and time.  Skin: Skin is warm and dry. No rash noted. No erythema.  Psychiatric: She has a normal mood and affect.     Lab Results  Component Value Date   WBC 14.9 (H) 08/30/2016   HGB 11.2 (L) 08/30/2016   HCT 34.6 (L) 08/30/2016   MCV 91.5 08/30/2016   PLT 310 08/30/2016    Lab Results  Component Value Date   CREATININE 1.05 (H) 08/30/2016   BUN 6 08/30/2016   NA 139 08/30/2016   K 4.0 08/30/2016   CL 105 08/30/2016   CO2 25 08/30/2016    Lab Results  Component Value Date   ALT 17 08/26/2016   AST 13 (L) 08/26/2016   ALKPHOS 73 08/26/2016     Microbiology: Recent Results (from the past 240 hour(s))  Urine Culture     Status: None   Collection Time: 08/21/16 11:15 PM  Result Value Ref Range Status   Specimen Description URINE, RANDOM  Final   Special Requests NONE  Final   Culture NO GROWTH  Final   Report Status 08/23/2016 FINAL  Final  Culture, blood (Routine X 2) w Reflex to ID Panel     Status: None   Collection Time: 08/21/16 11:58 PM  Result Value Ref Range Status   Specimen Description BLOOD RIGHT HAND  Final   Special Requests   Final    BOTTLES DRAWN AEROBIC AND ANAEROBIC Blood Culture adequate volume   Culture NO GROWTH 5 DAYS  Final   Report Status 08/27/2016 FINAL  Final  Culture, blood (Routine X 2) w Reflex to ID Panel     Status: None    Collection Time: 08/22/16 12:08 AM  Result Value Ref Range Status   Specimen Description BLOOD LEFT HAND  Final   Special Requests   Final    BOTTLES DRAWN AEROBIC AND ANAEROBIC Blood Culture adequate volume   Culture NO GROWTH 5 DAYS  Final   Report Status 08/27/2016 FINAL  Final  MRSA PCR Screening     Status: None   Collection Time: 08/26/16 11:32 AM  Result Value Ref Range Status  MRSA by PCR NEGATIVE NEGATIVE Final    Comment:        The GeneXpert MRSA Assay (FDA approved for NASAL specimens only), is one component of a comprehensive MRSA colonization surveillance program. It is not intended to diagnose MRSA infection nor to guide or monitor treatment for MRSA infections.   Aerobic/Anaerobic Culture (surgical/deep wound)     Status: None (Preliminary result)   Collection Time: 08/28/16  1:25 PM  Result Value Ref Range Status   Specimen Description ABSCESS LEFT GROIN  Final   Special Requests NONE  Final   Gram Stain   Final    RARE WBC PRESENT,BOTH PMN AND MONONUCLEAR NO ORGANISMS SEEN    Culture   Final    CULTURE REINCUBATED FOR BETTER GROWTH NO ANAEROBES ISOLATED; CULTURE IN PROGRESS FOR 5 DAYS    Report Status PENDING  Incomplete  Surgical pcr screen     Status: None   Collection Time: 08/29/16  3:58 AM  Result Value Ref Range Status   MRSA, PCR NEGATIVE NEGATIVE Final   Staphylococcus aureus NEGATIVE NEGATIVE Final    Comment:        The Xpert SA Assay (FDA approved for NASAL specimens in patients over 38 years of age), is one component of a comprehensive surveillance program.  Test performance has been validated by [facility] for patients greater than or equal to 21 year old. It is not intended to diagnose infection nor to guide or monitor treatment.     Will Annaston Upham, MS4 08/30/2016, 4:01 PM

## 2016-08-30 NOTE — Progress Notes (Addendum)
ANTICOAGULATION CONSULT NOTE - Follow Up Consult  Pharmacy Consult for heparin, now restarting warfarin Indication: h/o DVT  Labs:  Recent Labs  08/28/16 0257 08/29/16 0521 08/30/16 0404  HGB 12.5 11.8* 11.2*  HCT 37.7 35.8* 34.6*  PLT 347 294 310  HEPARINUNFRC 0.46 0.44 0.63  CREATININE 0.94 0.96 1.05*    Assessment: 56 yo F on warfarin PTA for hx DVT.  Pt was transitioned to heparin therapy pending the need for procedures related to her cellulitis/abscesses.    Now s/p Incision and drainage of left vulvar lesion, vulvar biopsy on 8/15. Heparin level continues to be therapeutic on 2400 units/hr, but is trending up. Hgb trending down slightly, pltc wnl, SCr also trending up, no bleeding noted per chart.   PTA dose: warfarin 7.5mg  PO daily (last dose 8/5)  Goal of Therapy:  Heparin level 0.3-0.7 units/ml Monitor platelets by anticoagulation protocol: Yes  Plan:  Check baseline INR Give warfarin 10 mg po x 1, then plan to continue home dosing of 7.5 mg daily Monitor daily INR, CBC, clinical course, s/sx of bleed, PO intake, DDI  Continue heparin 2350 units/hr (continue as bridge until INR therapeutic x 2) Daily heparin level and CBC  Thank you for allowing us to participate in this patients care.  Signe Coltonya C Lillian Ballester, PharmD Clinical phone for 08/30/2016 from 7a-3:30p: [phone] If after 3:30p, please call main pharmacy at: [phone] 08/30/2016 10:39 AM

## 2016-08-31 DIAGNOSIS — L03315 Cellulitis of perineum: Secondary | ICD-10-CM

## 2016-08-31 LAB — CBC
HCT: 34.4 % — ABNORMAL LOW (ref 36.0–46.0)
Hemoglobin: 10.8 g/dL — ABNORMAL LOW (ref 12.0–15.0)
MCH: 28.8 pg (ref 26.0–34.0)
MCHC: 31.4 g/dL (ref 30.0–36.0)
MCV: 91.7 fL (ref 78.0–100.0)
PLATELETS: 318 10*3/uL (ref 150–400)
RBC: 3.75 MIL/uL — AB (ref 3.87–5.11)
RDW: 14.3 % (ref 11.5–15.5)
WBC: 11 10*3/uL — AB (ref 4.0–10.5)

## 2016-08-31 LAB — PROTIME-INR
INR: 1.07
PROTHROMBIN TIME: 13.9 s (ref 11.4–15.2)

## 2016-08-31 LAB — GLUCOSE, CAPILLARY
GLUCOSE-CAPILLARY: 191 mg/dL — AB (ref 65–99)
GLUCOSE-CAPILLARY: 201 mg/dL — AB (ref 65–99)
Glucose-Capillary: 291 mg/dL — ABNORMAL HIGH (ref 65–99)
Glucose-Capillary: 234 mg/dL — ABNORMAL HIGH (ref 65–99)

## 2016-08-31 LAB — HEPARIN LEVEL (UNFRACTIONATED): Heparin Unfractionated: 0.5 IU/mL (ref 0.30–0.70)

## 2016-08-31 MED ORDER — DOXYCYCLINE HYCLATE 100 MG PO TABS
100.0000 mg | ORAL_TABLET | Freq: Two times a day (BID) | ORAL | Status: DC
  Administered 2016-08-31 – 2016-09-02 (×5): 100 mg via ORAL
  Filled 2016-08-31 (×5): qty 1

## 2016-08-31 MED ORDER — VANCOMYCIN HCL IN DEXTROSE 1-5 GM/200ML-% IV SOLN: 1000.0000 mg | Freq: Two times a day (BID) | INTRAVENOUS | Status: DC

## 2016-08-31 MED ORDER — AMOXICILLIN-POT CLAVULANATE 875-125 MG PO TABS
1.0000 | ORAL_TABLET | Freq: Two times a day (BID) | ORAL | Status: DC
  Administered 2016-08-31 – 2016-09-02 (×5): 1 via ORAL
  Filled 2016-08-31 (×5): qty 1

## 2016-08-31 NOTE — Care Management Note (Signed)
Case Management Note  Patient Details  Name: Hannah [name] MRN: [id number] Date of Birth: 1960-02-02  Subjective/Objective:     Pt with abscess and cellulitis in left perineal and left axillar area,hx of HTN, DM2, depression, anxiety, recurrent DVT on Coumadin, dCHF, iron deficiency anemia, tobacco abuse, CKD. From home with husband.     8/15  - S/p Incision and drainage of left vulvar lesion, vulvar biopsy and Liletta IUD insertion   Caryss Hodde (Spouse)     [phone]       PCP: Anselmo Pickler  Action/Plan: CM received consult: No insurance, need to discharge home on insulin--Needs help arranging wound care f/u as an outpatient.  Plan is to d/c pt to home with home health services, pt with wound, however pt without insurance and CM unable to find agency for charity case ( [facility], [facility], [facility] , [facility]).  CM has called and left voice message to set up out patient follow up appointment with [facility] [address] [place], [place] [address] [phone]  Appointment scheduled @ [facility] for 09/03/2016 @ 3pm, CM added to AVS and pt made aware.  Expected Discharge Date:                  Expected Discharge Plan:  Home w Home Health Services (resides with husband, [name])  In-House Referral:   CSW, referral made by CM regarding medicaid application process.  Discharge planning Services  CM consult  Post Acute Care Choice:    Choice offered to:  Patient  DME Arranged:    DME Agency:     HH Arranged:  RN HH Agency:     Status of Service:  In process, will continue to follow  If discussed at Long Length of Stay Meetings, dates discussed:    Additional Comments:  Epifanio Lesches, RN 08/31/2016, 11:08 AM

## 2016-08-31 NOTE — Progress Notes (Signed)
PROGRESS NOTE    Hannah [name]  CBU:[id number] DOB: Oct 14, 1960 DOA: 08/21/2016 PCP: Anselmo Pickler, MD   Brief Narrative: 56 year old female with HTN, DM2, depression, anxiety, recurrent DVT on Coumadin, dCHF, iron deficiency anemia, tobacco abuse, CKD, who presents with abscess and cellulitis in left perineal and left axillar areas. She was started on vanc/zosyn, heparin drip. Ob/gyl and gyn onc consulted, plan for I&D and biopsy today by Gyn.  Assessment & Plan:   # Labial/vulvar abscess and perineal cellulitis, persistent leukocytosis -Blood cultures x 2 no growth, mrsa screening negative. Leukocytosis improving. -s/p biopsy and I&D of vular abscess on 8/15.  -on Vanc/zosyn now. Follow up culture results and ID for further antibiotics recommendation. -Follow-up GYN for further wound care and plan. The dressing was changed today.  # Abscess/cellulitis of left axillary area: Left axilla abscess spontaneously ruptured,  Wound care consulted, input appreciated  # Hypokalemia/ hypomagnesemia: improved. Monitor labs.  # Diabetes mellitus with renal complications:previously not on insulin -Last A1c is 11.9. Patient is taking Amarylat home -Continue current insulin regimen. Monitor blood sugar level.  #Hx of DVT, recurent:on coumadin  -Continue Coumadin and IV heparin.  monitor INR.Marland Kitchen  #Chronic diastolic CHF (congestive heart failure) (HCC): Continue to monitor. Euvolemic on exam.  # Hypertension: Blood pressure acceptable. Continue losartan.  # Iron deficiency anemia: -Continue iron supplement  #Tobacco abuse: -Nicotine patch -Counsel  Obesity: Body mass index is 38.32 kg/m.  DVT prophylaxis: heparin Code Status:full Family Communication: No family at bedside Disposition Plan: Likely discharge home in 1-2 days    Consultants:   GYN  Infectious disease.  Procedures: None Antimicrobials: Vancomycin and Zosyn since admission and clindamycin August  14-16.  Subjective: Seen and examined at bedside. Denied chest pain, shortness of breath, nausea or vomiting. Dressing was changed by GYN today. Objective: Vitals:   08/30/16 0452 08/30/16 1510 08/30/16 2102 08/31/16 0554  BP: (!) 112/58 124/67 (!) 152/66 (!) 123/55  Pulse: 75 76 74 61  Resp: 17 18 18 20   Temp: 98.6 F (37 C) 97.6 F (36.4 C) 98 F (36.7 C)   TempSrc: Oral Oral Oral   SpO2: 94% 98% 96% 96%  Weight: 114 kg (251 lb 4.8 oz)   111.7 kg (246 lb 3.2 oz)  Height:        Intake/Output Summary (Last 24 hours) at 08/31/16 1246 Last data filed at 08/31/16 1113  Gross per 24 hour  Intake          1186.27 ml  Output              600 ml  Net           586.27 ml   Filed Weights   08/29/16 1236 08/30/16 0452 08/31/16 0554  Weight: 114.3 kg (252 lb) 114 kg (251 lb 4.8 oz) 111.7 kg (246 lb 3.2 oz)    Examination:  General exam: Not in distress  Respiratory system: Clear bilaterally, respiratory effort normal, no wheezing or crackle. Cardiovascular system: Regular rate rhythm, S1-S2 normal. No pedal edema. Gastrointestinal system: Abdomen soft, nontender. Bowel sounds positive. Central nervous system: Alert and oriented. No focal neurological deficits. Skin: No rashes, lesions or ulcers Psychiatry: Judgement and insight appear normal. Mood & affect appropriate.     Data Reviewed: I have personally reviewed following labs and imaging studies  CBC:  Recent Labs Lab 08/27/16 0231 08/28/16 0257 08/29/16 0521 08/30/16 0404 08/31/16 0501  WBC 18.7* 21.4* 15.0* 14.9* 11.0*  NEUTROABS  --   --   --  9.2*  --   HGB 12.2 12.5 11.8* 11.2* 10.8*  HCT 36.6 37.7 35.8* 34.6* 34.4*  MCV 89.1 89.3 90.4 91.5 91.7  PLT 315 347 294 310 318   Basic Metabolic Panel:  Recent Labs Lab 08/26/16 0432 08/27/16 0231 08/28/16 0257 08/29/16 0521 08/30/16 0404  NA 137 137 138 138 139  K 3.4* 3.6 3.6 3.5 4.0  CL 103 103 104 104 105  CO2 25 30 25 25 25   GLUCOSE 199* 228* 164*  238* 99  BUN 9 7 6 6 6   CREATININE 0.87 0.80 0.94 0.96 1.05*  CALCIUM 8.2* 8.3* 8.6* 8.5* 8.3*  MG 1.7 1.7  --  1.7 2.1   GFR: Estimated Creatinine Clearance: 76.5 mL/min (A) (by C-G formula based on SCr of 1.05 mg/dL (H)). Liver Function Tests:  Recent Labs Lab 08/26/16 1820  AST 13*  ALT 17  ALKPHOS 73  BILITOT 0.8  PROT 6.2*  ALBUMIN 1.8*   No results for input(s): LIPASE, AMYLASE in the last 168 hours. No results for input(s): AMMONIA in the last 168 hours. Coagulation Profile:  Recent Labs Lab 08/30/16 1452 08/31/16 0501  INR 1.03 1.07   Cardiac Enzymes: No results for input(s): CKTOTAL, CKMB, CKMBINDEX, TROPONINI in the last 168 hours. BNP (last 3 results) No results for input(s): PROBNP in the last 8760 hours. HbA1C: No results for input(s): HGBA1C in the last 72 hours. CBG:  Recent Labs Lab 08/30/16 0805 08/30/16 1252 08/30/16 1732 08/30/16 2143 08/31/16 0915  GLUCAP 102* 154* 184* 194* 191*   Lipid Profile: No results for input(s): CHOL, HDL, LDLCALC, TRIG, CHOLHDL, LDLDIRECT in the last 72 hours. Thyroid Function Tests: No results for input(s): TSH, T4TOTAL, FREET4, T3FREE, THYROIDAB in the last 72 hours. Anemia Panel: No results for input(s): VITAMINB12, FOLATE, FERRITIN, TIBC, IRON, RETICCTPCT in the last 72 hours. Sepsis Labs:  Recent Labs Lab 08/27/16 0231  LATICACIDVEN 1.0    Recent Results (from the past 240 hour(s))  Urine Culture     Status: None   Collection Time: 08/21/16 11:15 PM  Result Value Ref Range Status   Specimen Description URINE, RANDOM  Final   Special Requests NONE  Final   Culture NO GROWTH  Final   Report Status 08/23/2016 FINAL  Final  Culture, blood (Routine X 2) w Reflex to ID Panel     Status: None   Collection Time: 08/21/16 11:58 PM  Result Value Ref Range Status   Specimen Description BLOOD RIGHT HAND  Final   Special Requests   Final    BOTTLES DRAWN AEROBIC AND ANAEROBIC Blood Culture adequate volume    Culture NO GROWTH 5 DAYS  Final   Report Status 08/27/2016 FINAL  Final  Culture, blood (Routine X 2) w Reflex to ID Panel     Status: None   Collection Time: 08/22/16 12:08 AM  Result Value Ref Range Status   Specimen Description BLOOD LEFT HAND  Final   Special Requests   Final    BOTTLES DRAWN AEROBIC AND ANAEROBIC Blood Culture adequate volume   Culture NO GROWTH 5 DAYS  Final   Report Status 08/27/2016 FINAL  Final  MRSA PCR Screening     Status: None   Collection Time: 08/26/16 11:32 AM  Result Value Ref Range Status   MRSA by PCR NEGATIVE NEGATIVE Final    Comment:        The GeneXpert MRSA Assay (FDA approved for NASAL specimens only), is one component of a comprehensive MRSA  colonization surveillance program. It is not intended to diagnose MRSA infection nor to guide or monitor treatment for MRSA infections.   Aerobic/Anaerobic Culture (surgical/deep wound)     Status: None (Preliminary result)   Collection Time: 08/28/16  1:25 PM  Result Value Ref Range Status   Specimen Description ABSCESS LEFT GROIN  Final   Special Requests NONE  Final   Gram Stain   Final    RARE WBC PRESENT,BOTH PMN AND MONONUCLEAR NO ORGANISMS SEEN    Culture   Final    FEW YEAST IDENTIFICATION TO FOLLOW NO ANAEROBES ISOLATED; CULTURE IN PROGRESS FOR 5 DAYS CULTURE REINCUBATED FOR BETTER GROWTH    Report Status PENDING  Incomplete  Surgical pcr screen     Status: None   Collection Time: 08/29/16  3:58 AM  Result Value Ref Range Status   MRSA, PCR NEGATIVE NEGATIVE Final   Staphylococcus aureus NEGATIVE NEGATIVE Final    Comment:        The Xpert SA Assay (FDA approved for NASAL specimens in patients over 24 years of age), is one component of a comprehensive surveillance program.  Test performance has been validated by [facility] for patients greater than or equal to 29 year old. It is not intended to diagnose infection nor to guide or monitor treatment.           Radiology Studies: No results found.      Scheduled Meds: . clotrimazole   Topical BID  . famotidine  20 mg Oral BID  . ferrous sulfate  325 mg Oral Q breakfast  . insulin aspart  0-5 Units Subcutaneous QHS  . insulin aspart  0-9 Units Subcutaneous TID WC  . insulin aspart  3 Units Subcutaneous TID WC  . insulin aspart protamine- aspart  14 Units Subcutaneous BID WC  . losartan  25 mg Oral Daily  . nicotine  21 mg Transdermal Daily  . polyethylene glycol  17 g Oral Daily  . pregabalin  100 mg Oral BID  . senna-docusate  1 tablet Oral BID  . traZODone  150 mg Oral QHS  . Warfarin - Pharmacist Dosing Inpatient   Does not apply q1800   Continuous Infusions: . heparin 2,350 Units/hr (08/31/16 1044)  . piperacillin-tazobactam (ZOSYN)  IV 3.375 g (08/31/16 1109)  . vancomycin       LOS: 10 days    Dron Jaynie Collins, MD [facility] Pager [phone]  If 7PM-7AM, please contact night-coverage [email or web address] Password [facility] 08/31/2016, 12:46 PM

## 2016-08-31 NOTE — Progress Notes (Signed)
PT Cancellation Note  Patient Details Name: Hannah [name] MRN: [id number] DOB: [birth year]   Cancelled Treatment:    Reason Eval/Treat Not Completed: Other (comment) Pt is alseep when PT arrives x2. Attempted at 2:37P and again at 3:28P. Pt is not easily aroused. MD re-ordered PT. Per RN, pt is able to get OOB to use the bathroom without assistance. PT will check back later as time allows to re-evaluate as needed.    Colin Broach PT, DPT  [phone]  08/31/2016, 3:27 PM

## 2016-08-31 NOTE — Progress Notes (Signed)
Pharmacy Antibiotic Note  Hannah [name] is a 56 y.o. female admitted on 08/21/2016 with abscess and cellulitis in perineal and left axillar areas.  Pharmacy has been consulted for Vancomycin and Zosyn dosing.  Today is antibiotic day #11 (including doses received from [facility]).  Patient now s/p I&D of labial abscess. Infectious disease is on board and recommended continuing Vanc/Zosyn pending final cultures. (risk for mixed aerobic anaerobic infection). SCr is trending up and patient is at risk for vancomycin accumulation due to BMI.   8/14 Vancomycin trough = 16  Plan: Vancomycin trough tonight prior to 2100 dose Vancomycin 1000mg  IV q12h  Zosyn 3.375 gm IV q8h (4 hour infusion)  Height: 5\' 8"  (172.7 cm) Weight: 246 lb 3.2 oz (111.7 kg) IBW/kg (Calculated) : 63.9  Temp (24hrs), Avg:97.8 F (36.6 C), Min:97.6 F (36.4 C), Max:98 F (36.7 C)   Recent Labs Lab 08/24/16 1955  08/26/16 0432 08/27/16 0231 08/28/16 0257 08/28/16 2030 08/29/16 0521 08/30/16 0404 08/31/16 0501  WBC  --   < > 18.8* 18.7* 21.4*  --  15.0* 14.9* 11.0*  CREATININE  --   < > 0.87 0.80 0.94  --  0.96 1.05*  --   LATICACIDVEN  --   --   --  1.0  --   --   --   --   --   VANCOTROUGH 10*  --   --   --   --  16  --   --   --   < > = values in this interval not displayed.  Estimated Creatinine Clearance: 76.5 mL/min (A) (by C-G formula based on SCr of 1.05 mg/dL (H)).    Allergies  Allergen Reactions  . Naproxen Other (See Comments)    Emotional Reaction Cramps      Thank you for allowing [place] to participate in this patients care.  Signe Colt, PharmD Clinical phone for 08/31/2016 from 7a-3:30p: [phone] If after 3:30p, please call main pharmacy at: [phone] 08/31/2016 9:34 AM

## 2016-08-31 NOTE — Progress Notes (Addendum)
Subjective: Interval History:now > 24 hours after I and D. No packing change yesterday. Improved pain.  Objective: Vital signs in last 24 hours: Temp:  [97.6 F (36.4 C)-98 F (36.7 C)] 98 F (36.7 C) (08/16 2102) Pulse Rate:  [61-76] 61 (08/17 0554) Resp:  [18-20] 20 (08/17 0554) BP: (123-152)/(55-67) 123/55 (08/17 0554) SpO2:  [96 %-98 %] 96 % (08/17 0554) Weight:  [246 lb 3.2 oz (111.7 kg)] 246 lb 3.2 oz (111.7 kg) (08/17 0554)  Intake/Output from previous day: 08/16 0701 - 08/17 0700 In: 1765.5 [P.O.:600; I.V.:565.5] Out: 700 [Urine:700]  General appearance: alert, cooperative, appears older than stated age and moderately obese Lungs: normal effort Abdomen: normal effort Pelvic: continued induration in left labia majora, mons but less. There is active draining from incision site Packing removed and repacked with packing soaked in H2O2  Results for orders placed or performed during the hospital encounter of 08/21/16 (from the past 24 hour(s))  Glucose, capillary     Status: Abnormal   Collection Time: 08/30/16 12:52 PM  Result Value Ref Range   Glucose-Capillary 154 (H) 65 - 99 mg/dL  Protime-INR     Status: None   Collection Time: 08/30/16  2:52 PM  Result Value Ref Range   Prothrombin Time 13.6 11.4 - 15.2 seconds   INR 1.03   Glucose, capillary     Status: Abnormal   Collection Time: 08/30/16  5:32 PM  Result Value Ref Range   Glucose-Capillary 184 (H) 65 - 99 mg/dL  Glucose, capillary     Status: Abnormal   Collection Time: 08/30/16  9:43 PM  Result Value Ref Range   Glucose-Capillary 194 (H) 65 - 99 mg/dL  CBC     Status: Abnormal   Collection Time: 08/31/16  5:01 AM  Result Value Ref Range   WBC 11.0 (H) 4.0 - 10.5 K/uL   RBC 3.75 (L) 3.87 - 5.11 MIL/uL   Hemoglobin 10.8 (L) 12.0 - 15.0 g/dL   HCT 28.4 (L) 13.2 - 44.0 %   MCV 91.7 78.0 - 100.0 fL   MCH 28.8 26.0 - 34.0 pg   MCHC 31.4 30.0 - 36.0 g/dL   RDW 10.2 72.5 - 36.6 %   Platelets 318 150 -  400 K/uL  Heparin level (unfractionated)     Status: None   Collection Time: 08/31/16  5:01 AM  Result Value Ref Range   Heparin Unfractionated 0.50 0.30 - 0.70 IU/mL  Protime-INR     Status: None   Collection Time: 08/31/16  5:01 AM  Result Value Ref Range   Prothrombin Time 13.9 11.4 - 15.2 seconds   INR 1.07   Glucose, capillary     Status: Abnormal   Collection Time: 08/31/16  9:15 AM  Result Value Ref Range   Glucose-Capillary 191 (H) 65 - 99 mg/dL   Scheduled Meds: . clotrimazole   Topical BID  . famotidine  20 mg Oral BID  . ferrous sulfate  325 mg Oral Q breakfast  . insulin aspart  0-5 Units Subcutaneous QHS  . insulin aspart  0-9 Units Subcutaneous TID WC  . insulin aspart  3 Units Subcutaneous TID WC  . insulin aspart protamine- aspart  14 Units Subcutaneous BID WC  . losartan  25 mg Oral Daily  . nicotine  21 mg Transdermal Daily  . polyethylene glycol  17 g Oral Daily  . pregabalin  100 mg Oral BID  . senna-docusate  1 tablet Oral BID  . traZODone  150 mg Oral QHS  . Warfarin - Pharmacist Dosing Inpatient   Does not apply q1800   Continuous Infusions: . heparin 2,350 Units/hr (08/30/16 2144)  . piperacillin-tazobactam (ZOSYN)  IV Stopped (08/31/16 0508)  . vancomycin Stopped (08/31/16 0040)   PRN Meds:acetaminophen **OR** acetaminophen, EPINEPHrine, fluticasone, hydrALAZINE, HYDROmorphone (DILAUDID) injection, ondansetron, oxyCODONE-acetaminophen, SUMAtriptan, tiZANidine, zolpidem  Assessment/Plan: Vulvar abscess - s/p I and D Needs daily packing change Needs Home Health to do changes Continue IV Abx until discharge On Heparin, resume coumadin target INR 2-3. It is very important to ensure excellent glycemic control to aid in healing.   LOS: 10 days   Reva Bores, MD 08/31/2016 9:36 AM

## 2016-08-31 NOTE — Progress Notes (Signed)
[facility]  Date of Admission:  08/21/2016   Total days of antibiotics 12        Day 12 of Vancomycin (8/6 - )        Day 12 of Zosyn (8/6 - )  Recommendations: - Continue IV vancomycin and Zosyn - Follow up cultures to guide antibiotic selection. Blood cultures obtained at [facility] show no growth x2.  Thank you for this consult. We will continue to follow to provide recommendations for antibiotic selection and duration of therapy. Please contact us if you have questions.   ASSESSMENT: This 56 yo female with a history of poorly controlled DM, obesity, HTN, DVT on warfarin, CHF, and CKD stage 2 who presents with labial abscess. Now s/p I&D of abscess (8/15), Gram stain of abscess showed rare WBC and no organisms. Culture has grown few yeast to date, however this might be contaminant, will follow results. Blood cultures on intake after antibiotic initiation show no growth. The patient associates formation of these abscesses in areas following vigorous scratching to relieve likely tinea cruris. Her uncontrolled diabetes is likely making her more prone to developing these abscesses. These recurrent abscesses could house staphylococcus or streptococcus, but given the location and presence of spontaneous drainage, there is concern for possible mixed aerobic and anaerobic infection. These can be covered by vancomycin and Zosyn for now, will follow culture data. Blood cultures obtained at [facility] show no growth x2.   Principal Problem:   Labial abscess Active Problems:   Iron deficiency anemia   Essential hypertension   DVT (deep venous thrombosis) (HCC)   Diabetes mellitus with renal complications (HCC)   Cellulitis, perineum   Abscess of left axilla   Cellulitis of axilla, left   Chronic diastolic CHF (congestive heart failure) (HCC)   Supratherapeutic INR   CKD (chronic kidney disease), stage III   . clotrimazole   Topical BID  . famotidine  20 mg Oral BID   . ferrous sulfate  325 mg Oral Q breakfast  . insulin aspart  0-5 Units Subcutaneous QHS  . insulin aspart  0-9 Units Subcutaneous TID WC  . insulin aspart  3 Units Subcutaneous TID WC  . insulin aspart protamine- aspart  14 Units Subcutaneous BID WC  . losartan  25 mg Oral Daily  . nicotine  21 mg Transdermal Daily  . polyethylene glycol  17 g Oral Daily  . pregabalin  100 mg Oral BID  . senna-docusate  1 tablet Oral BID  . traZODone  150 mg Oral QHS  . Warfarin - Pharmacist Dosing Inpatient   Does not apply q1800    SUBJECTIVE: Patient reports feeling tired, but reports improvement in her pain. Denies chills, headache, chest pain, shortness of breath, nausea, vomiting, abdominal pain.   Review of Systems: Review of Systems  All other systems reviewed and are negative.   Allergies  Allergen Reactions  . Naproxen Other (See Comments)    Emotional Reaction Cramps    OBJECTIVE: Vitals:   08/30/16 0452 08/30/16 1510 08/30/16 2102 08/31/16 0554  BP: (!) 112/58 124/67 (!) 152/66 (!) 123/55  Pulse: 75 76 74 61  Resp: 17 18 18 20   Temp: 98.6 F (37 C) 97.6 F (36.4 C) 98 F (36.7 C)   TempSrc: Oral Oral Oral   SpO2: 94% 98% 96% 96%  Weight: 114 kg (251 lb 4.8 oz)   111.7 kg (246 lb 3.2 oz)  Height:  Body mass index is 37.43 kg/m.  Physical Exam  Constitutional: She is oriented to person, place, and time.  Lying in bed, comfortable appearing, no acute distress  HENT:  Head: Normocephalic.  Eyes: Conjunctivae are normal. No scleral icterus.  Neck: Neck supple.  Cardiovascular: Normal rate, regular rhythm and normal heart sounds.  Exam reveals no gallop and no friction rub.   No murmur heard. Pulmonary/Chest: Effort normal and breath sounds normal. No respiratory distress. She has no rales.  Abdominal: Soft. Bowel sounds are normal. She exhibits no distension. There is no tenderness. There is no rebound and no guarding.  Genitourinary:  Genitourinary Comments:  Wound dressed in perineum.  Musculoskeletal: She exhibits no edema or tenderness.  Lymphadenopathy:    She has no cervical adenopathy.  Neurological: She is alert and oriented to person, place, and time.  Skin: Skin is warm and dry. No rash noted. No erythema.  Psychiatric: Mood and affect normal.    Lab Results Lab Results  Component Value Date   WBC 11.0 (H) 08/31/2016   HGB 10.8 (L) 08/31/2016   HCT 34.4 (L) 08/31/2016   MCV 91.7 08/31/2016   PLT 318 08/31/2016    Lab Results  Component Value Date   CREATININE 1.05 (H) 08/30/2016   BUN 6 08/30/2016   NA 139 08/30/2016   K 4.0 08/30/2016   CL 105 08/30/2016   CO2 25 08/30/2016    Lab Results  Component Value Date   ALT 17 08/26/2016   AST 13 (L) 08/26/2016   ALKPHOS 73 08/26/2016   BILITOT 0.8 08/26/2016     Microbiology: Recent Results (from the past 240 hour(s))  Urine Culture     Status: None   Collection Time: 08/21/16 11:15 PM  Result Value Ref Range Status   Specimen Description URINE, RANDOM  Final   Special Requests NONE  Final   Culture NO GROWTH  Final   Report Status 08/23/2016 FINAL  Final  Culture, blood (Routine X 2) w Reflex to ID Panel     Status: None   Collection Time: 08/21/16 11:58 PM  Result Value Ref Range Status   Specimen Description BLOOD RIGHT HAND  Final   Special Requests   Final    BOTTLES DRAWN AEROBIC AND ANAEROBIC Blood Culture adequate volume   Culture NO GROWTH 5 DAYS  Final   Report Status 08/27/2016 FINAL  Final  Culture, blood (Routine X 2) w Reflex to ID Panel     Status: None   Collection Time: 08/22/16 12:08 AM  Result Value Ref Range Status   Specimen Description BLOOD LEFT HAND  Final   Special Requests   Final    BOTTLES DRAWN AEROBIC AND ANAEROBIC Blood Culture adequate volume   Culture NO GROWTH 5 DAYS  Final   Report Status 08/27/2016 FINAL  Final  MRSA PCR Screening     Status: None   Collection Time: 08/26/16 11:32 AM  Result Value Ref Range Status   MRSA  by PCR NEGATIVE NEGATIVE Final    Comment:        The GeneXpert MRSA Assay (FDA approved for NASAL specimens only), is one component of a comprehensive MRSA colonization surveillance program. It is not intended to diagnose MRSA infection nor to guide or monitor treatment for MRSA infections.   Aerobic/Anaerobic Culture (surgical/deep wound)     Status: None (Preliminary result)   Collection Time: 08/28/16  1:25 PM  Result Value Ref Range Status   Specimen Description ABSCESS LEFT  GROIN  Final   Special Requests NONE  Final   Gram Stain   Final    RARE WBC PRESENT,BOTH PMN AND MONONUCLEAR NO ORGANISMS SEEN    Culture   Final    FEW YEAST IDENTIFICATION TO FOLLOW NO ANAEROBES ISOLATED; CULTURE IN PROGRESS FOR 5 DAYS CULTURE REINCUBATED FOR BETTER GROWTH    Report Status PENDING  Incomplete  Surgical pcr screen     Status: None   Collection Time: 08/29/16  3:58 AM  Result Value Ref Range Status   MRSA, PCR NEGATIVE NEGATIVE Final   Staphylococcus aureus NEGATIVE NEGATIVE Final    Comment:        The Xpert SA Assay (FDA approved for NASAL specimens in patients over 41 years of age), is one component of a comprehensive surveillance program.  Test performance has been validated by [facility] for patients greater than or equal to 54 year old. It is not intended to diagnose infection nor to guide or monitor treatment.     Will Edith Groleau, MSS4 08/31/2016, 8:46 AM

## 2016-08-31 NOTE — Progress Notes (Signed)
ANTICOAGULATION CONSULT NOTE - Follow Up Consult  Pharmacy Consult for heparin, now restarting warfarin Indication: h/o DVT  Labs:  Recent Labs  08/29/16 0521 08/30/16 0404 08/30/16 1452 08/31/16 0501  HGB 11.8* 11.2*  --  10.8*  HCT 35.8* 34.6*  --  34.4*  PLT 294 310  --  318  LABPROT  --   --  13.6 13.9  INR  --   --  1.03 1.07  HEPARINUNFRC 0.44 0.63  --  0.50  CREATININE 0.96 1.05*  --   --     Assessment: 56 yo F on warfarin PTA for hx DVT.  Pt was transitioned to heparin therapy pending the need for procedures related to her cellulitis/abscesses.    Now s/p Incision and drainage of left vulvar lesion, vulvar biopsy on 8/15. Heparin level continues to be therapeutic on 2350 units/hr. Hgb trending down slightly, pltc wnl. INR subtherapeutic as expected, no bleeding noted per chart.   PTA dose: warfarin 7.5mg  PO daily (last PTA dose 8/5)  Goal of Therapy:  Heparin level 0.3-0.7 units/ml Monitor platelets by anticoagulation protocol: Yes  Plan:   Give warfarin 7.5 mg PO daily Monitor daily INR, CBC, clinical course, s/sx of bleed, PO intake, DDI  Continue heparin 2350 units/hr (continue as bridge until INR therapeutic x 2) Daily heparin level and CBC   Thank you for allowing [place] to participate in this patients care.  Signe Colt, PharmD Clinical phone for 08/31/2016 from 7a-3:30p: x [phone] If after 3:30p, please call main pharmacy at: [phone] 08/31/2016 11:04 AM

## 2016-09-01 DIAGNOSIS — L03112 Cellulitis of left axilla: Secondary | ICD-10-CM

## 2016-09-01 LAB — GLUCOSE, CAPILLARY
GLUCOSE-CAPILLARY: 166 mg/dL — AB (ref 65–99)
GLUCOSE-CAPILLARY: 116 mg/dL — AB (ref 65–99)
Glucose-Capillary: 171 mg/dL — ABNORMAL HIGH (ref 65–99)
Glucose-Capillary: 153 mg/dL — ABNORMAL HIGH (ref 65–99)

## 2016-09-01 LAB — CBC
HEMATOCRIT: 34.6 % — AB (ref 36.0–46.0)
Hemoglobin: 11.1 g/dL — ABNORMAL LOW (ref 12.0–15.0)
MCH: 30 pg (ref 26.0–34.0)
MCHC: 32.1 g/dL (ref 30.0–36.0)
MCV: 93.5 fL (ref 78.0–100.0)
Platelets: 330 10*3/uL (ref 150–400)
RBC: 3.7 MIL/uL — AB (ref 3.87–5.11)
RDW: 14.8 % (ref 11.5–15.5)
WBC: 9.8 10*3/uL (ref 4.0–10.5)

## 2016-09-01 LAB — PROTIME-INR
INR: 1.14
Prothrombin Time: 14.7 seconds (ref 11.4–15.2)

## 2016-09-01 LAB — HEPARIN LEVEL (UNFRACTIONATED): Heparin Unfractionated: 0.59 IU/mL (ref 0.30–0.70)

## 2016-09-01 MED ORDER — WARFARIN SODIUM 7.5 MG PO TABS
7.5000 mg | ORAL_TABLET | Freq: Once | ORAL | Status: AC
  Administered 2016-09-01: 7.5 mg via ORAL
  Filled 2016-09-01: qty 1

## 2016-09-01 NOTE — Progress Notes (Signed)
Physical Therapy Treatment Patient Details Name: Hannah [name] MRN: [id number] DOB: [birth year] Today's Date: 09/01/2016    History of Present Illness Pt is a 56 y/o female admitted secondary to abscess and cellulitis in perineal and L axillar areas. s/p I&D labial abscess. PMH including but not limited to HTN, depression, DM, CHF and CKD.    PT Comments    Patient progressing well towards PT goals. Tolerated gait training with RW for safety. 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 Pt would benefit from rollator with seat to improve endurance and allow for more independence with mobility as well as decreased fall risk. Will attempt rollator training next session as tolerated. Will follow to improve balance and overall safety mobility.     Follow Up Recommendations  No PT follow up     Equipment Recommendations  Other (comment) (rollator (4 wheeled walker with seat))    Recommendations for Other Services       Precautions / Restrictions Precautions Precautions: None Restrictions Weight Bearing Restrictions: No    Mobility  Bed Mobility Overal bed mobility: Needs Assistance Bed Mobility: Supine to Sit;Sit to Supine     Supine to sit: Modified independent (Device/Increase time);HOB elevated Sit to supine: Modified independent (Device/Increase time);HOB elevated   General bed mobility comments: No assist needed, use of rail. Anticipatory pain which results in increased time to get to EOB, no assist needed.  Transfers Overall transfer level: Needs assistance Equipment used: None Transfers: Sit to/from Stand Sit to Stand: Modified independent (Device/Increase time)         General transfer comment: No assist needed. Stood from [employer].   Ambulation/Gait Ambulation/Gait assistance: Supervision Ambulation Distance (Feet): 350 Feet Assistive device: Rolling walker (2 wheeled) Gait Pattern/deviations: Step-through  pattern;Decreased stride length;Trunk flexed   Gait velocity interpretation: at or above normal speed for age/gender General Gait Details: Steady gait. 2/4 DOE. Safe use of RW. Cues to decrease speed.   Stairs            Wheelchair Mobility    Modified Rankin (Stroke Patients Only)       Balance Overall balance assessment: Needs assistance Sitting-balance support: Feet supported;No upper extremity supported Sitting balance-Leahy Scale: Good Sitting balance - Comments: ABle to reach down and donn socks without difficulty.    Standing balance support: During functional activity Standing balance-Leahy Scale: Good Standing balance comment: Able to ambulate short distances in room without UE support, feels more comfortable with UE support for longer distances.                             Cognition Arousal/Alertness: Awake/alert Behavior During Therapy: WFL for tasks assessed/performed Overall Cognitive Status: Within Functional Limits for tasks assessed                                        Exercises      General Comments General comments (skin integrity, edema, etc.): Pt has been getting and walking to/from bathroom independently managing IV pole      Pertinent Vitals/Pain Pain Assessment: Faces Faces Pain Scale: Hurts a little bit Pain Location: perineal area Pain Descriptors / Indicators: Sore;Tender Pain Intervention(s): Monitored during session;Repositioned    Home Living  Prior Function            PT Goals (current goals can now be found in the care plan section) Progress towards PT goals: Progressing toward goals    Frequency    Min 3X/week      PT Plan Current plan remains appropriate    Co-evaluation              AM-PAC PT "6 Clicks" Daily Activity  Outcome Measure  Difficulty turning over in bed (including adjusting bedclothes, sheets and blankets)?: None Difficulty moving  from lying on back to sitting on the side of the bed? : None Difficulty sitting down on and standing up from a chair with arms (e.g., wheelchair, bedside commode, etc,.)?: None Help needed moving to and from a bed to chair (including a wheelchair)?: None Help needed walking in hospital room?: None Help needed climbing 3-5 steps with a railing? : A Little 6 Click Score: 23    End of Session Equipment Utilized During Treatment: Gait belt Activity Tolerance: Patient tolerated treatment well Patient left: in bed;with call bell/phone within reach Nurse Communication: Mobility status PT Visit Diagnosis: Other abnormalities of gait and mobility (R26.89);Pain Pain - part of body:  (groin)     Time: 6546-5035 PT Time Calculation (min) (ACUTE ONLY): 21 min  Charges:  $Gait Training: 8-22 mins                    G Codes:       Mylo Red, PT, DPT [phone]     Blake Divine A Sayward Horvath 09/01/2016, 11:53 AM

## 2016-09-01 NOTE — Progress Notes (Signed)
PROGRESS NOTE    Hannah [name]  ZOX:[id number] DOB: January 05, 1961 DOA: 08/21/2016 PCP: Anselmo Pickler, MD   Brief Narrative: 56 year old female with HTN, DM2, depression, anxiety, recurrent DVT on Coumadin, dCHF, iron deficiency anemia, tobacco abuse, CKD, who presents with abscess and cellulitis in left perineal and left axillar areas. She was started on vanc/zosyn, heparin drip. Ob/gyl and gyn onc consulted, s/p I&D and biopsy  by Gyn.  Assessment & Plan:   # Labial/vulvar abscess and perineal cellulitis, persistent leukocytosis -Blood cultures x 2 no growth, mrsa screening negative. Leukocytosis improving. -s/p biopsy and I&D of vular abscess on 8/15.  -Antibiotics changed to Augmentin and doxycycline by ID. Off IV antibiotics now. Daily packing and dressing changes as per Gyn.  # Abscess/cellulitis of left axillary area: Left axilla abscess spontaneously ruptured,  Wound care consulted, input appreciated  # Hypokalemia/ hypomagnesemia: improved. Monitor labs.  # Diabetes mellitus with renal complications:previously not on insulin -Last A1c is 11.9. Patient is taking [place] home -Continue current insulin regimen. Monitor blood sugar level.  #Hx of DVT, recurent:on coumadin  - INR is still subtherapeutic. Continue Coumadin and IV heparin.  #Chronic diastolic CHF (congestive heart failure) (HCC): Continue to monitor. Euvolemic on exam.  # Hypertension: Blood pressure acceptable. Continue losartan.  # Iron deficiency anemia: -Continue iron supplement  #Tobacco abuse: -Nicotine patch -Counsel  Obesity: Body mass index is 38.32 kg/m.  DVT prophylaxis: heparin Code Status:full Family Communication: No family at bedside Disposition Plan: Likely discharge home in 1-2 days    Consultants:   GYN  Infectious disease.  Procedures: None Antimicrobials: Vancomycin and Zosyn since 8/7 to 8/17. Clindamycin August 14-16. Augmentin and minocycline since  August 17  Subjective: Seen and examined at bedside. Denied headache, dizziness, nausea vomiting Or Shortness of Breath.  Objective: Vitals:   08/31/16 0554 08/31/16 1308 08/31/16 2202 09/01/16 0520  BP: (!) 123/55 (!) 120/56 134/67 (!) 125/57  Pulse: 61 65 (!) 58 65  Resp: 20 14 18 18   Temp:   98.4 F (36.9 C) 98.3 F (36.8 C)  TempSrc:   Oral Oral  SpO2: 96% 99% 98% 97%  Weight: 111.7 kg (246 lb 3.2 oz)     Height:        Intake/Output Summary (Last 24 hours) at 09/01/16 1345 Last data filed at 09/01/16 8119  Gross per 24 hour  Intake           410.08 ml  Output                0 ml  Net           410.08 ml   Filed Weights   08/29/16 1236 08/30/16 0452 08/31/16 0554  Weight: 114.3 kg (252 lb) 114 kg (251 lb 4.8 oz) 111.7 kg (246 lb 3.2 oz)    Examination:  General exam: Not in distress Respiratory system: Clear bilaterally, respiratory effort normal Cardiovascular system: Regular rate rhythm, S1 is normal Gastrointestinal system: Abdomen soft, nontender. Bowel sounds positive. Central nervous system: Alert and oriented. No focal neurological deficits. Skin: No rashes, lesions or ulcers Psychiatry: Judgement and insight appear normal. Mood & affect appropriate.     Data Reviewed: I have personally reviewed following labs and imaging studies  CBC:  Recent Labs Lab 08/28/16 0257 08/29/16 0521 08/30/16 0404 08/31/16 0501 09/01/16 0418  WBC 21.4* 15.0* 14.9* 11.0* 9.8  NEUTROABS  --   --  9.2*  --   --   HGB 12.5 11.8* 11.2*  10.8* 11.1*  HCT 37.7 35.8* 34.6* 34.4* 34.6*  MCV 89.3 90.4 91.5 91.7 93.5  PLT 347 294 310 318 330   Basic Metabolic Panel:  Recent Labs Lab 08/26/16 0432 08/27/16 0231 08/28/16 0257 08/29/16 0521 08/30/16 0404  NA 137 137 138 138 139  K 3.4* 3.6 3.6 3.5 4.0  CL 103 103 104 104 105  CO2 25 30 25 25 25   GLUCOSE 199* 228* 164* 238* 99  BUN 9 7 6 6 6   CREATININE 0.87 0.80 0.94 0.96 1.05*  CALCIUM 8.2* 8.3* 8.6* 8.5* 8.3*    MG 1.7 1.7  --  1.7 2.1   GFR: Estimated Creatinine Clearance: 76.5 mL/min (A) (by C-G formula based on SCr of 1.05 mg/dL (H)). Liver Function Tests:  Recent Labs Lab 08/26/16 1820  AST 13*  ALT 17  ALKPHOS 73  BILITOT 0.8  PROT 6.2*  ALBUMIN 1.8*   No results for input(s): LIPASE, AMYLASE in the last 168 hours. No results for input(s): AMMONIA in the last 168 hours. Coagulation Profile:  Recent Labs Lab 08/30/16 1452 08/31/16 0501 09/01/16 0418  INR 1.03 1.07 1.14   Cardiac Enzymes: No results for input(s): CKTOTAL, CKMB, CKMBINDEX, TROPONINI in the last 168 hours. BNP (last 3 results) No results for input(s): PROBNP in the last 8760 hours. HbA1C: No results for input(s): HGBA1C in the last 72 hours. CBG:  Recent Labs Lab 08/31/16 1307 08/31/16 1659 08/31/16 2157 09/01/16 0900 09/01/16 1232  GLUCAP 291* 234* 201* 166* 116*   Lipid Profile: No results for input(s): CHOL, HDL, LDLCALC, TRIG, CHOLHDL, LDLDIRECT in the last 72 hours. Thyroid Function Tests: No results for input(s): TSH, T4TOTAL, FREET4, T3FREE, THYROIDAB in the last 72 hours. Anemia Panel: No results for input(s): VITAMINB12, FOLATE, FERRITIN, TIBC, IRON, RETICCTPCT in the last 72 hours. Sepsis Labs:  Recent Labs Lab 08/27/16 0231  LATICACIDVEN 1.0    Recent Results (from the past 240 hour(s))  MRSA PCR Screening     Status: None   Collection Time: 08/26/16 11:32 AM  Result Value Ref Range Status   MRSA by PCR NEGATIVE NEGATIVE Final    Comment:        The GeneXpert MRSA Assay (FDA approved for NASAL specimens only), is one component of a comprehensive MRSA colonization surveillance program. It is not intended to diagnose MRSA infection nor to guide or monitor treatment for MRSA infections.   Aerobic/Anaerobic Culture (surgical/deep wound)     Status: None (Preliminary result)   Collection Time: 08/28/16  1:25 PM  Result Value Ref Range Status   Specimen Description ABSCESS  LEFT GROIN  Final   Special Requests NONE  Final   Gram Stain   Final    RARE WBC PRESENT,BOTH PMN AND MONONUCLEAR NO ORGANISMS SEEN    Culture   Final    FEW CANDIDA GLABRATA NO ANAEROBES ISOLATED; CULTURE IN PROGRESS FOR 5 DAYS    Report Status PENDING  Incomplete  Surgical pcr screen     Status: None   Collection Time: 08/29/16  3:58 AM  Result Value Ref Range Status   MRSA, PCR NEGATIVE NEGATIVE Final   Staphylococcus aureus NEGATIVE NEGATIVE Final    Comment:        The Xpert SA Assay (FDA approved for NASAL specimens in patients over 72 years of age), is one component of a comprehensive surveillance program.  Test performance has been validated by [facility] for patients greater than or equal to 13 year old. It is  not intended to diagnose infection nor to guide or monitor treatment.          Radiology Studies: No results found.      Scheduled Meds: . amoxicillin-clavulanate  1 tablet Oral Q12H  . clotrimazole   Topical BID  . doxycycline  100 mg Oral Q12H  . famotidine  20 mg Oral BID  . ferrous sulfate  325 mg Oral Q breakfast  . insulin aspart  0-5 Units Subcutaneous QHS  . insulin aspart  0-9 Units Subcutaneous TID WC  . insulin aspart  3 Units Subcutaneous TID WC  . insulin aspart protamine- aspart  14 Units Subcutaneous BID WC  . losartan  25 mg Oral Daily  . nicotine  21 mg Transdermal Daily  . polyethylene glycol  17 g Oral Daily  . pregabalin  100 mg Oral BID  . senna-docusate  1 tablet Oral BID  . traZODone  150 mg Oral QHS  . warfarin  7.5 mg Oral ONCE-1800  . Warfarin - Pharmacist Dosing Inpatient   Does not apply q1800   Continuous Infusions: . heparin 2,350 Units/hr (09/01/16 0706)     LOS: 11 days    Alberto Schoch Jaynie Collins, MD [facility] Pager [phone]  If 7PM-7AM, please contact night-coverage [email or web address] Password [email or web address] 09/01/2016, 1:45 PM

## 2016-09-01 NOTE — Progress Notes (Signed)
ANTICOAGULATION CONSULT NOTE - Follow Up Consult  Pharmacy Consult for heparin>warfarin restart Indication: h/o DVT  Labs:  Recent Labs  08/30/16 0404 08/30/16 1452 08/31/16 0501 09/01/16 0418  HGB 11.2*  --  10.8* 11.1*  HCT 34.6*  --  34.4* 34.6*  PLT 310  --  318 330  LABPROT  --  13.6 13.9 14.7  INR  --  1.03 1.07 1.14  HEPARINUNFRC 0.63  --  0.50 0.59  CREATININE 1.05*  --   --   --     Assessment: 56 yo F on warfarin PTA for hx DVT.  Pt was transitioned to heparin therapy pending the need for procedures related to her cellulitis/abscesses. Now to restart warfarin as of 08/30/2016, unfortunately missed dose on 8/17 as order was not put in. Today is D#3 of heparin+warfarin bridge.   Heparin level continues to be therapeutic on 2350 units/hr.  INR continues to be subtherapeutic at 1.14 today.  CBC stable, no bleeding noted.  Variable PO intake, DDI noted with new ABX inpatient, was on bactrim PTA.   PTA dose: warfarin 7.5mg  PO daily (last PTA dose 8/5) INR 2.74 (therapeutic) on admission  Goal of Therapy:  Heparin level 0.3-0.7 units/ml Monitor platelets by anticoagulation protocol: Yes  Plan:  Give warfarin 7.5 mg PO x1 dose tonight. Continue heparin 2350 units/hr Monitor daily heparin level, INR, CBC, clinical course, s/sx of bleed, PO intake, DDI  Thank you for allowing [place] to participate in this patients care.  Donnella Bi, PharmD PGY1 Acute Care Pharmacy Resident Pager: [phone]    I discussed / reviewed the pharmacy note by Dr. Guy Begin  and I agree with his findings and plans as documented.  Allena Katz, Pharm.D. PGY1 Pharmacy Resident 09/01/2016  9:35 AM Pager [phone]

## 2016-09-01 NOTE — Evaluation (Signed)
Occupational Therapy Evaluation/Discharge Patient Details Name: Hannah [name] MRN: [id number] DOB: [birth year] Today's Date: 09/01/2016    History of Present Illness Pt is a 56 y/o female admitted secondary to abscess and cellulitis in perineal and L axillar areas. s/p I&D labial abscess. PMH including but not limited to HTN, depression, DM, CHF and CKD.   Clinical Impression   Patient evaluated by Occupational Therapy with no further acute OT needs identified. Educated pt concerning safe shower transfers and discussed use of shower seat to maximize safety and pt indicates understanding. She was able to complete all ADL in hospital setting with general supervision. All education has been completed and the patient has no further questions. See below for any follow-up Occupational Therapy or equipment needs. OT to sign off. Thank you for referral.      Follow Up Recommendations  No OT follow up;Supervision/Assistance - 24 hour    Equipment Recommendations  Tub/shower seat    Recommendations for Other Services       Precautions / Restrictions Precautions Precautions: None Restrictions Weight Bearing Restrictions: No      Mobility Bed Mobility Overal bed mobility: Modified Independent                Transfers Overall transfer level: Needs assistance Equipment used: Rolling walker (2 wheeled) Transfers: Sit to/from Stand Sit to Stand: Supervision              Balance Overall balance assessment: Needs assistance Sitting-balance support: Feet supported;No upper extremity supported Sitting balance-Leahy Scale: Good     Standing balance support: During functional activity Standing balance-Leahy Scale: Good Standing balance comment: Able to ambulate short distances in room without UE support, feels more comfortable with UE support for longer distances.                            ADL either performed or assessed with clinical judgement   ADL Overall  ADL's : Needs assistance/impaired                                       General ADL Comments: Pt was able to ambulate to sink and complete multiple standing grooming tasks with overall supervision for safety when utilizing RW. Pt much more unsteady without RW and demonstrates good understanding of need to use RW during functional tasks.      Vision         Perception     Praxis      Pertinent Vitals/Pain Pain Assessment: Faces Faces Pain Scale: Hurts a little bit Pain Location: perineal area Pain Descriptors / Indicators: Sore;Tender Pain Intervention(s): Monitored during session;Repositioned     Hand Dominance     Extremity/Trunk Assessment Upper Extremity Assessment Upper Extremity Assessment: Overall WFL for tasks assessed   Lower Extremity Assessment Lower Extremity Assessment: Overall WFL for tasks assessed       Communication Communication Communication: No difficulties   Cognition Arousal/Alertness: Awake/alert Behavior During Therapy: WFL for tasks assessed/performed Overall Cognitive Status: Within Functional Limits for tasks assessed                                     General Comments       Exercises     Shoulder Instructions      Home Living  Family/patient expects to be discharged to:: Private residence Living Arrangements: Spouse/significant other Available Help at Discharge: Family;Available 24 hours/day Type of Home: House Home Access: Stairs to enter [employer] of Steps: 3 Entrance Stairs-Rails: None Home Layout: One level     Bathroom Shower/Tub: Producer, television/film/video: Standard     Home Equipment: Bedside commode          Prior Functioning/Environment Level of Independence: Independent                 OT Problem List: Decreased strength      OT Treatment/Interventions:      OT Goals(Current goals can be found in the care plan section) Acute Rehab OT  Goals Patient Stated Goal: decrease pain OT Goal Formulation: With patient Time For Goal Achievement: 09/15/16 Potential to Achieve Goals: Good  OT Frequency:     Barriers to D/C:            Co-evaluation              AM-PAC PT "6 Clicks" Daily Activity     Outcome Measure Help from another person eating meals?: None Help from another person taking care of personal grooming?: A Little Help from another person toileting, which includes using toliet, bedpan, or urinal?: A Little Help from another person bathing (including washing, rinsing, drying)?: A Little Help from another person to put on and taking off regular upper body clothing?: None Help from another person to put on and taking off regular lower body clothing?: A Little 6 Click Score: 20   End of Session Equipment Utilized During Treatment: Rolling walker  Activity Tolerance: Patient tolerated treatment well Patient left: in bed (preparing for bathing task with CNA)  OT Visit Diagnosis: Unsteadiness on feet (R26.81);Pain Pain - Right/Left:  (perineal area) Pain - part of body:  (perineal area)                Time: 1610-9604 OT Time Calculation (min): 14 min Charges:  OT General Charges $OT Visit: 1 Procedure OT Evaluation $OT Eval Moderate Complexity: 1 Procedure G-Codes:     Doristine Section, MS OTR/L  Pager: [phone]   Jerrod Damiano A Jameca Chumley 09/01/2016, 5:45 PM

## 2016-09-02 DIAGNOSIS — E1169 Type 2 diabetes mellitus with other specified complication: Secondary | ICD-10-CM

## 2016-09-02 LAB — CBC
HEMATOCRIT: 36.7 % (ref 36.0–46.0)
HEMOGLOBIN: 11.9 g/dL — AB (ref 12.0–15.0)
MCH: 29.7 pg (ref 26.0–34.0)
MCHC: 32.4 g/dL (ref 30.0–36.0)
MCV: 91.5 fL (ref 78.0–100.0)
Platelets: 351 10*3/uL (ref 150–400)
RBC: 4.01 MIL/uL (ref 3.87–5.11)
RDW: 14.3 % (ref 11.5–15.5)
WBC: 10.6 10*3/uL — ABNORMAL HIGH (ref 4.0–10.5)

## 2016-09-02 LAB — PROTIME-INR
INR: 1.18
Prothrombin Time: 15.1 seconds (ref 11.4–15.2)

## 2016-09-02 LAB — GLUCOSE, CAPILLARY: Glucose-Capillary: 149 mg/dL — ABNORMAL HIGH (ref 65–99)

## 2016-09-02 LAB — HEPARIN LEVEL (UNFRACTIONATED): Heparin Unfractionated: 0.59 IU/mL (ref 0.30–0.70)

## 2016-09-02 MED ORDER — AMOXICILLIN-POT CLAVULANATE 875-125 MG PO TABS: 1.0000 | ORAL_TABLET | Freq: Two times a day (BID) | ORAL | 0 refills | Status: AC

## 2016-09-02 MED ORDER — ENOXAPARIN SODIUM 100 MG/ML ~~LOC~~ SOLN: 1.0000 mg/kg | Freq: Two times a day (BID) | SUBCUTANEOUS | 0 refills | Status: DC

## 2016-09-02 MED ORDER — TRAMADOL HCL 50 MG PO TABS: 50.0000 mg | ORAL_TABLET | Freq: Two times a day (BID) | ORAL | 0 refills | Status: AC | PRN

## 2016-09-02 MED ORDER — INSULIN SYRINGE-NEEDLE U-100 26G X 1/2" 1 ML MISC: 0 refills | Status: DC

## 2016-09-02 MED ORDER — WARFARIN SODIUM 7.5 MG PO TABS: 7.5000 mg | ORAL_TABLET | Freq: Once | ORAL | Status: DC

## 2016-09-02 MED ORDER — BLOOD GLUCOSE MONITOR KIT: PACK | 0 refills | Status: DC

## 2016-09-02 MED ORDER — DOXYCYCLINE HYCLATE 100 MG PO TABS: 100.0000 mg | ORAL_TABLET | Freq: Two times a day (BID) | ORAL | 0 refills | Status: AC

## 2016-09-02 MED ORDER — INSULIN ASPART PROT & ASPART (70-30 MIX) 100 UNIT/ML ~~LOC~~ SUSP: 18.0000 [IU] | Freq: Two times a day (BID) | SUBCUTANEOUS | 1 refills | Status: DC

## 2016-09-02 NOTE — Progress Notes (Signed)
ANTICOAGULATION CONSULT NOTE - Follow Up Consult  Pharmacy Consult for heparin>warfarin restart Indication: h/o DVT  Labs:  Recent Labs  08/31/16 0501 09/01/16 0418 09/02/16 0553  HGB 10.8* 11.1* 11.9*  HCT 34.4* 34.6* 36.7  PLT 318 330 351  LABPROT 13.9 14.7 15.1  INR 1.07 1.14 1.18  HEPARINUNFRC 0.50 0.59 0.59    Assessment: 56 yo F on warfarin PTA for hx DVT.  Pt was transitioned to heparin therapy pending the need for procedures related to her cellulitis/abscesses. Restarted warfarin as of 08/30/2016. Today is D#4 of heparin+warfarin bridge.   Heparin level continues to be therapeutic on 2350 units/hr.  INR continues to be subtherapeutic at 1.18 today.  CBC stable, no bleeding noted.  Variable PO intake, DDI noted with new ABX inpatient (doxycycline/augmentin), was on bactrim PTA.   PTA dose: warfarin 7.5mg  PO daily (last PTA dose 8/5) INR 2.74 (therapeutic) on admission  Goal of Therapy:  INR 2-3 Heparin level 0.3-0.7 units/ml Monitor platelets by anticoagulation protocol: Yes  Plan:  Give warfarin 7.5 mg PO x1 dose tonight. Continue heparin 2350 units/hr until INR therapeutic x 2 Daily heparin level, INR, CBC Monitor clinical course, s/sx of bleed, PO intake, DDI  Thank you for allowing [place] to participate in this patients care.  Donnella Bi, PharmD PGY1 Acute Care Pharmacy Resident Pager: [phone] 09/02/2016 8:43 AM

## 2016-09-02 NOTE — Discharge Summary (Signed)
Physician Discharge Summary  Roseann Kees ATF:[id number] DOB: 08-May-1960 DOA: 08/21/2016  PCP: Christ Kick, MD  Admit date: 08/21/2016 Discharge date: 09/02/2016  Admitted From:home Disposition:home  Recommendations for Outpatient Follow-up:  1. Follow up with PCP in 1-2 weeks 2. Please obtain BMP/CBC in one week 3. Follow-up with wound care center tomorrow.   Home Health: No Equipment/Devices: No Discharge Condition: Stable CODE STATUS: Full code Diet recommendation: Carb modified heart healthy diet  Brief/Interim Summary: 56 year old female with HTN, DM2, depression, anxiety, recurrent DVT on Coumadin, dCHF, iron deficiency anemia, tobacco abuse, CKD, who presents with abscess and cellulitis in left perineal and left axillar areas. She was started on vanc/zosyn, heparin drip. Ob/gyl and gyn onc consulted, s/p I&D and biopsy  by Gyn  # Labial/vulvar abscess and perineal cellulitis -Blood cultures x 2 no growth, mrsa screening negative.  -s/p biopsy and I&D of vular abscess on 8/15.  - patient follows up at wound care clinic tomorrow. She already has follow-up appointment. -Discharge with oral antibiotics to oxacillin and Augmentin for 5 more days. Clinically improved.  # Abscess/cellulitis of left axillary area: Left axilla abscess spontaneously ruptured,  Improved.  # Hypokalemia/ hypomagnesemia: improved. Monitor labs.  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 Patient needs tight control of blood sugar level. She was started on insulin in the hospital. Education provided to the patient. Recommended to monitor blood sugar level at home and follow up with PCP. She verbalized understanding.  #Hx of DVT, recurent:INR is still subtherapeutic. Plan to discharge home today with Coumadin and Lovenox subcutaneous for bridging. Patient was educated on anticoagulation and watch for any sign of bleeding.  Recommended to discontinue Lovenox when INR is at least 2.  #Chronic diastolic CHF (congestive heart failure) ([place]):  Euvolemic on exam.  # Hypertension: Blood pressure acceptable. Continue losartan.  # Iron deficiency anemia: -Continue iron supplement  #Tobacco abuse: -Counsel  Obesity: Body mass index is 38.32 kg/m.  Discharge Diagnoses:  Principal Problem:   Labial abscess Active Problems:   Iron deficiency anemia   Essential hypertension   DVT (deep venous thrombosis) (HCC)   Diabetes mellitus with renal complications (HCC)   Cellulitis, perineum   Abscess of left axilla   Cellulitis of axilla, left   Chronic diastolic CHF (congestive heart failure) (HCC)   Subtherapeutic INR      Discharge Instructions  Discharge Instructions    Call MD for:  difficulty breathing, headache or visual disturbances    Complete by:  As directed    Call MD for:  extreme fatigue    Complete by:  As directed    Call MD for:  hives    Complete by:  As directed    Call MD for:  persistant dizziness or light-headedness    Complete by:  As directed    Call MD for:  persistant nausea and vomiting    Complete by:  As directed    Call MD for:  severe uncontrolled pain    Complete by:  As directed    Call MD for:  temperature >100.4    Complete by:  As directed    Diet - low sodium heart healthy    Complete by:  As directed    Diet Carb Modified    Complete by:  As directed    Discharge instructions    Complete by:  As directed    Please monitor blood sugar level and follow-up with  your PCP for the management of diabetes. Please watch for any sign of bleeding. Stop Lovenox subcutaneous when INR is at least 2   Increase activity slowly    Complete by:  As directed      Allergies as of 09/02/2016      Reactions   Naproxen Other (See Comments)   Emotional Reaction Cramps      Medication List    STOP taking these medications   glimepiride 4 MG tablet Commonly known as:   AMARYL   sulfamethoxazole-trimethoprim 800-160 MG tablet Commonly known as:  BACTRIM DS,SEPTRA DS     TAKE these medications   amoxicillin-clavulanate 875-125 MG tablet Commonly known as:  AUGMENTIN Take 1 tablet by mouth every 12 (twelve) hours.   blood glucose meter kit and supplies Kit Dispense based on patient and insurance preference. Use up to four times daily as directed. (FOR ICD-9 250.00, 250.01).   doxycycline 100 MG tablet Commonly known as:  VIBRA-TABS Take 1 tablet (100 mg total) by mouth every 12 (twelve) hours.   enoxaparin 100 MG/ML injection Commonly known as:  LOVENOX Inject 1.1 mLs (110 mg total) into the skin every 12 (twelve) hours.   EPINEPHrine 0.3 mg/0.3 mL Soaj injection Commonly known as:  EPI-PEN See admin instructions.   FERROUS SULFATE PO Take 325 mg by mouth.   insulin aspart protamine- aspart (70-30) 100 UNIT/ML injection Commonly known as:  NOVOLOG MIX 70/30 Inject 0.18 mLs (18 Units total) into the skin 2 (two) times daily with a meal.   Insulin Syringe-Needle U-100 26G X 1/2" 1 ML Misc Use 2 times a day   losartan 25 MG tablet Commonly known as:  COZAAR Take 25 mg by mouth daily.   meloxicam 15 MG tablet Commonly known as:  MOBIC Take 15 mg by mouth.   pregabalin 100 MG capsule Commonly known as:  LYRICA Take 100 mg by mouth 2 (two) times daily.   SUMAtriptan 100 MG tablet Commonly known as:  IMITREX TAKE 1 TABLET EVERY DAY AS DIRECTED   tiZANidine 4 MG tablet Commonly known as:  ZANAFLEX Take 4 mg by mouth 2 (two) times daily.   traMADol 50 MG tablet Commonly known as:  ULTRAM Take 1 tablet (50 mg total) by mouth every 12 (twelve) hours as needed. What changed:  when to take this  reasons to take this   traZODone 150 MG tablet Commonly known as:  DESYREL Take 150 mg by mouth at bedtime.   VITAMIN B COMPLEX PO Take by mouth.   Vitamin D (Ergocalciferol) 50000 units Caps capsule Commonly known as:  DRISDOL Take  50,000 Units by mouth once a week. WEDNESDAYS   warfarin 7.5 MG tablet Commonly known as:  COUMADIN Take 7.5 mg by mouth daily.      Follow-up Information    Achreja, Dickey Gave, MD. Schedule an appointment as soon as possible for a visit in 1 week(s).   Specialty:  Family Medicine Contact information: [address] [place] [address] [phone]        [place]. Go on 09/03/2016.   Why:  Wound care appointment schedule for 09/03/2016 at [facility] information: [facility] [address] [place], [place] [address] [phone]         Donnamae Jude, MD Follow up.   Specialty:  Obstetrics and Gynecology Why:  as needed Contact information: [place] [address] [phone]  Allergies  Allergen Reactions  . Naproxen Other (See Comments)    Emotional Reaction Cramps    Consultations:  GYN Infectious disease  Procedures/Studies: I&D  Subjective: Seen and examined at bedside. Denied headache, dizziness, nausea vomiting chest pain shortness of breath. Eager to go home today. Denies pain. She has follow-up appointment at wound care clinic tomorrow. Her husband at bedside.  Discharge Exam: Vitals:   09/01/16 2255 09/02/16 0451  BP: 139/62 (!) 142/60  Pulse: 71 (!) 59  Resp: 18 18  Temp: 98.1 F (36.7 C) 98.2 F (36.8 C)  SpO2: 95% 99%   Vitals:   09/01/16 0520 09/01/16 1440 09/01/16 2255 09/02/16 0451  BP: (!) 125/57 132/63 139/62 (!) 142/60  Pulse: 65 60 71 (!) 59  Resp: 18 16 18 18   Temp: 98.3 F (36.8 C) (!) 97.5 F (36.4 C) 98.1 F (36.7 C) 98.2 F (36.8 C)  TempSrc: Oral Oral Oral Oral  SpO2: 97% 99% 95% 99%  Weight:    109.6 kg (241 lb 9.6 oz)  Height:        General: Pt is alert, awake, not in acute distress Cardiovascular: RRR, S1/S2 +, no rubs, no gallops Respiratory: CTA bilaterally, no wheezing, no rhonchi Abdominal: Soft, NT, ND, bowel  sounds + Extremities: no edema, no cyanosis    The results of significant diagnostics from this hospitalization (including imaging, microbiology, ancillary and laboratory) are listed below for reference.     Microbiology: Recent Results (from the past 240 hour(s))  MRSA PCR Screening     Status: None   Collection Time: 08/26/16 11:32 AM  Result Value Ref Range Status   MRSA by PCR NEGATIVE NEGATIVE Final    Comment:        The GeneXpert MRSA Assay (FDA approved for NASAL specimens only), is one component of a comprehensive MRSA colonization surveillance program. It is not intended to diagnose MRSA infection nor to guide or monitor treatment for MRSA infections.   Aerobic/Anaerobic Culture (surgical/deep wound)     Status: None (Preliminary result)   Collection Time: 08/28/16  1:25 PM  Result Value Ref Range Status   Specimen Description ABSCESS LEFT GROIN  Final   Special Requests NONE  Final   Gram Stain   Final    RARE WBC PRESENT,BOTH PMN AND MONONUCLEAR NO ORGANISMS SEEN    Culture   Final    FEW CANDIDA GLABRATA NO ANAEROBES ISOLATED; CULTURE IN PROGRESS FOR 5 DAYS    Report Status PENDING  Incomplete  Surgical pcr screen     Status: None   Collection Time: 08/29/16  3:58 AM  Result Value Ref Range Status   MRSA, PCR NEGATIVE NEGATIVE Final   Staphylococcus aureus NEGATIVE NEGATIVE Final    Comment:        The Xpert SA Assay (FDA approved for NASAL specimens in patients over 31 years of age), is one component of a comprehensive surveillance program.  Test performance has been validated by [facility] for patients greater than or equal to 22 year old. It is not intended to diagnose infection nor to guide or monitor treatment.      Labs: BNP (last 3 results)  Recent Labs  08/21/16 2358  BNP 389.3*   Basic Metabolic Panel:  Recent Labs Lab 08/27/16 0231 08/28/16 0257 08/29/16 0521 08/30/16 0404  NA 137 138 138 139  K 3.6 3.6 3.5 4.0  CL  103 104 104 105  CO2 30 25 25 25   GLUCOSE 228* 164*  238* 99  BUN 7 6 6 6   CREATININE 0.80 0.94 0.96 1.05*  CALCIUM 8.3* 8.6* 8.5* 8.3*  MG 1.7  --  1.7 2.1   Liver Function Tests:  Recent Labs Lab 08/26/16 1820  AST 13*  ALT 17  ALKPHOS 73  BILITOT 0.8  PROT 6.2*  ALBUMIN 1.8*   No results for input(s): LIPASE, AMYLASE in the last 168 hours. No results for input(s): AMMONIA in the last 168 hours. CBC:  Recent Labs Lab 08/29/16 0521 08/30/16 0404 08/31/16 0501 09/01/16 0418 09/02/16 0553  WBC 15.0* 14.9* 11.0* 9.8 10.6*  NEUTROABS  --  9.2*  --   --   --   HGB 11.8* 11.2* 10.8* 11.1* 11.9*  HCT 35.8* 34.6* 34.4* 34.6* 36.7  MCV 90.4 91.5 91.7 93.5 91.5  PLT 294 310 318 330 351   Cardiac Enzymes: No results for input(s): CKTOTAL, CKMB, CKMBINDEX, TROPONINI in the last 168 hours. BNP: Invalid input(s): POCBNP CBG:  Recent Labs Lab 09/01/16 0900 09/01/16 1232 09/01/16 1756 09/01/16 2257 09/02/16 0824  GLUCAP 166* 116* 171* 153* 149*   D-Dimer No results for input(s): DDIMER in the last 72 hours. Hgb A1c No results for input(s): HGBA1C in the last 72 hours. Lipid Profile No results for input(s): CHOL, HDL, LDLCALC, TRIG, CHOLHDL, LDLDIRECT in the last 72 hours. Thyroid function studies No results for input(s): TSH, T4TOTAL, T3FREE, THYROIDAB in the last 72 hours.  Invalid input(s): FREET3 Anemia work up No results for input(s): VITAMINB12, FOLATE, FERRITIN, TIBC, IRON, RETICCTPCT in the last 72 hours. Urinalysis No results found for: COLORURINE, APPEARANCEUR, [place], [place], [place], [place], [place], Ages, PROTEINUR, UROBILINOGEN, NITRITE, LEUKOCYTESUR Sepsis Labs Invalid input(s): PROCALCITONIN,  WBC,  LACTICIDVEN Microbiology Recent Results (from the past 240 hour(s))  MRSA PCR Screening     Status: None   Collection Time: 08/26/16 11:32 AM  Result Value Ref Range Status   MRSA by PCR NEGATIVE NEGATIVE Final    Comment:        The  GeneXpert MRSA Assay (FDA approved for NASAL specimens only), is one component of a comprehensive MRSA colonization surveillance program. It is not intended to diagnose MRSA infection nor to guide or monitor treatment for MRSA infections.   Aerobic/Anaerobic Culture (surgical/deep wound)     Status: None (Preliminary result)   Collection Time: 08/28/16  1:25 PM  Result Value Ref Range Status   Specimen Description ABSCESS LEFT GROIN  Final   Special Requests NONE  Final   Gram Stain   Final    RARE WBC PRESENT,BOTH PMN AND MONONUCLEAR NO ORGANISMS SEEN    Culture   Final    FEW CANDIDA GLABRATA NO ANAEROBES ISOLATED; CULTURE IN PROGRESS FOR 5 DAYS    Report Status PENDING  Incomplete  Surgical pcr screen     Status: None   Collection Time: 08/29/16  3:58 AM  Result Value Ref Range Status   MRSA, PCR NEGATIVE NEGATIVE Final   Staphylococcus aureus NEGATIVE NEGATIVE Final    Comment:        The Xpert SA Assay (FDA approved for NASAL specimens in patients over 64 years of age), is one component of a comprehensive surveillance program.  Test performance has been validated by [facility] for patients greater than or equal to 20 year old. It is not intended to diagnose infection nor to guide or monitor treatment.      Time coordinating discharge: 40 minutes  SIGNED:   Rosita Fire, MD  [facility] 09/02/2016, 11:52 AM  If 7PM-7AM, please contact night-coverage [email or web address] Password [email or web address]

## 2016-09-02 NOTE — Care Management Note (Signed)
Case Management Note  Patient Details  Name: Hannah [name] MRN: [id number] Date of Birth: 1960-10-20  Subjective/Objective: pt will be discharged today with New Lantus Insulin. Provided with MATCH letter. Also has PCP. Talked about GoodRx. Samples from PCP and use of Vials of Insulin etc.   Action/Plan: CM will sign off for now but will be available should additional discharge needs arise or disposition change.                     Expected Discharge Date:  09/02/16               Expected Discharge Plan:  Home w Home Health Services (resides with husband, Nutritional therapist)  In-House Referral:     Discharge planning Services  CM Consult, [facility])  Post Acute Care Choice:    Choice offered to:  Patient  DME Arranged:  Tub bench, 3-N-1 DME Agency:  [facility]  HH Arranged:    HH Agency:     Status of Service:  Completed, signed off  If discussed at Long Length of Stay Meetings, dates discussed:    Additional Comments:  Yvone Neu, RN 09/02/2016, 12:38 PM

## 2016-09-02 NOTE — Progress Notes (Signed)
Nsg Discharge Note  Admit Date:  08/21/2016 Discharge date: 09/02/2016   Claiborne Billings Day Weiss to be D/C'd Home per MD order.  AVS completed.  Copy for chart, and copy for patient signed, and dated. Patient/caregiver able to verbalize understanding.  Discharge Medication: Allergies as of 09/02/2016      Reactions   Naproxen Other (See Comments)   Emotional Reaction Cramps      Medication List    STOP taking these medications   glimepiride 4 MG tablet Commonly known as:  AMARYL   sulfamethoxazole-trimethoprim 800-160 MG tablet Commonly known as:  BACTRIM DS,SEPTRA DS     TAKE these medications   amoxicillin-clavulanate 875-125 MG tablet Commonly known as:  AUGMENTIN Take 1 tablet by mouth every 12 (twelve) hours.   blood glucose meter kit and supplies Kit Dispense based on patient and insurance preference. Use up to four times daily as directed. (FOR ICD-9 250.00, 250.01).   doxycycline 100 MG tablet Commonly known as:  VIBRA-TABS Take 1 tablet (100 mg total) by mouth every 12 (twelve) hours.   enoxaparin 100 MG/ML injection Commonly known as:  LOVENOX Inject 1.1 mLs (110 mg total) into the skin every 12 (twelve) hours.   EPINEPHrine 0.3 mg/0.3 mL Soaj injection Commonly known as:  EPI-PEN See admin instructions.   FERROUS SULFATE PO Take 325 mg by mouth.   insulin aspart protamine- aspart (70-30) 100 UNIT/ML injection Commonly known as:  NOVOLOG MIX 70/30 Inject 0.18 mLs (18 Units total) into the skin 2 (two) times daily with a meal.   Insulin Syringe-Needle U-100 26G X 1/2" 1 ML Misc Use 2 times a day   losartan 25 MG tablet Commonly known as:  COZAAR Take 25 mg by mouth daily.   meloxicam 15 MG tablet Commonly known as:  MOBIC Take 15 mg by mouth.   pregabalin 100 MG capsule Commonly known as:  LYRICA Take 100 mg by mouth 2 (two) times daily.   SUMAtriptan 100 MG tablet Commonly known as:  IMITREX TAKE 1 TABLET EVERY DAY AS DIRECTED   tiZANidine 4 MG  tablet Commonly known as:  ZANAFLEX Take 4 mg by mouth 2 (two) times daily.   traMADol 50 MG tablet Commonly known as:  ULTRAM Take 1 tablet (50 mg total) by mouth every 12 (twelve) hours as needed. What changed:  when to take this  reasons to take this   traZODone 150 MG tablet Commonly known as:  DESYREL Take 150 mg by mouth at bedtime.   VITAMIN B COMPLEX PO Take by mouth.   Vitamin D (Ergocalciferol) 50000 units Caps capsule Commonly known as:  DRISDOL Take 50,000 Units by mouth once a week. WEDNESDAYS   warfarin 7.5 MG tablet Commonly known as:  COUMADIN Take 7.5 mg by mouth daily.            Durable Medical Equipment        Start     Ordered   09/02/16 1238  For home use only DME Other see comment  Once    Comments:  [place]   09/02/16 1237   09/02/16 1237  For home use only DME Tub bench  Once     09/02/16 1237      Discharge Assessment: Vitals:   09/01/16 2255 09/02/16 0451  BP: 139/62 (!) 142/60  Pulse: 71 (!) 59  Resp: 18 18  Temp: 98.1 F (36.7 C) 98.2 F (36.8 C)  SpO2: 95% 99%   Skin clean, dry and intact without evidence  of skin break down, no evidence of skin tears noted. IV catheter discontinued intact. Site without signs and symptoms of complications - no redness or edema noted at insertion site, patient denies c/o pain - only slight tenderness at site.  Dressing with slight pressure applied.  D/c Instructions-Education: Discharge instructions given to patient/family with verbalized understanding. D/c education completed with patient/family including follow up instructions, medication list, d/c activities limitations if indicated, with other d/c instructions as indicated by MD - patient able to verbalize understanding, all questions fully answered. Patient instructed to return to ED, call 911, or call MD for any changes in condition.  Patient escorted via [place], and D/C home via private auto.  Salley Slaughter, RN 09/02/2016 1:55 PM

## 2016-09-04 LAB — AEROBIC/ANAEROBIC CULTURE W GRAM STAIN (SURGICAL/DEEP WOUND)

## 2016-09-04 LAB — AEROBIC/ANAEROBIC CULTURE (SURGICAL/DEEP WOUND)

## 2018-06-11 IMAGING — CT CT PELVIS W/ CM
2 of 3 series · 15 of 46 positions shown, 17 images · IV contrast (Omni 300)
Comparison: 08/20/2016

CLINICAL DATA: Pt c/o "deep" pelvic pain, worse on LEFT side. Pt is
having difficulty urinating, but is unsure if that is because of the
pain when sitting down, or inability to urinate. Hx of posterior
pelvic infection.

EXAM:
CT PELVIS WITH CONTRAST
TECHNIQUE: Multidetector CT imaging of the pelvis was performed using the
standard protocol following the bolus administration of intravenous
contrast.
CONTRAST:  75 ml SX8LNA-OKK IOPAMIDOL (SX8LNA-OKK) INJECTION 61%

[Series 3: pelvis with 5.0 · axial · 0.98mm/px · z∈[+689,+939]mm · 12 of 58 slices shown, 14 images]
[im 4/58  soft-tissue]
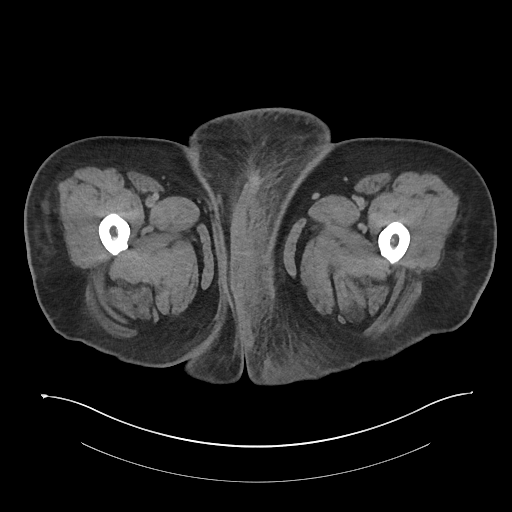
[im 4/58  bone]
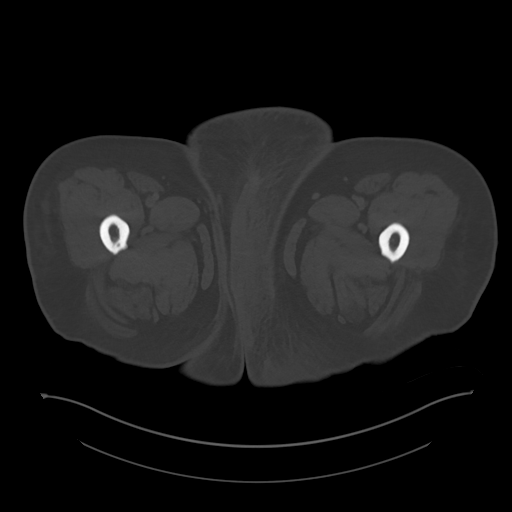
[im 8/58  soft-tissue]
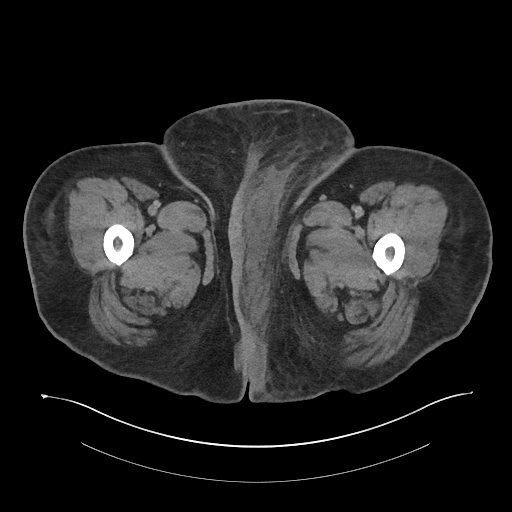
[im 13/58  soft-tissue]
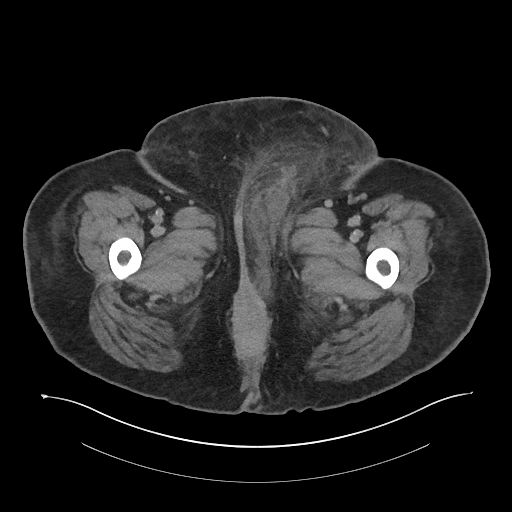
[im 17/58  soft-tissue]
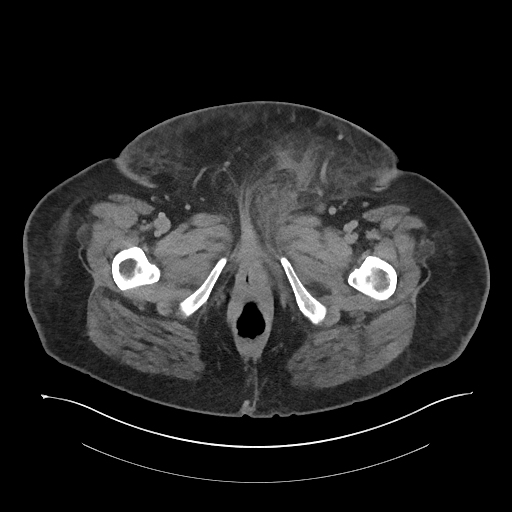
[im 23/58  soft-tissue]
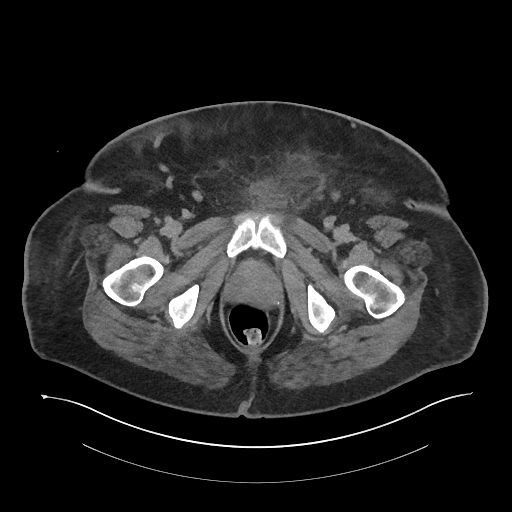
[im 26/58  soft-tissue]
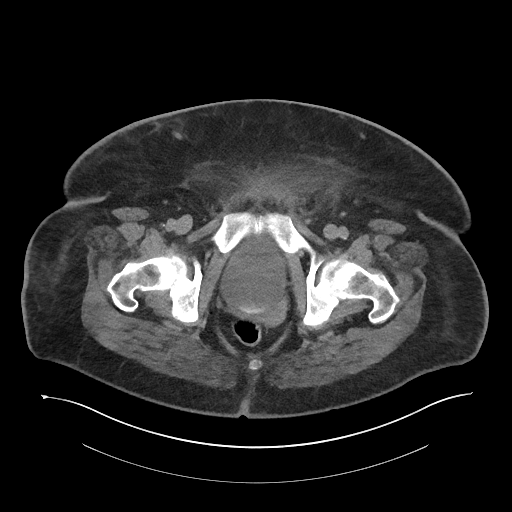
[im 32/58  soft-tissue]
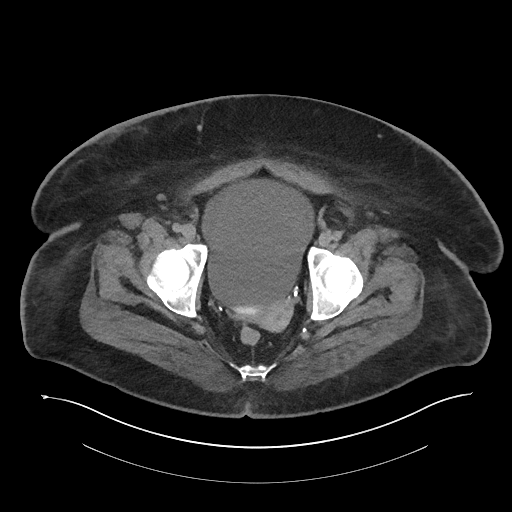
[im 35/58  soft-tissue]
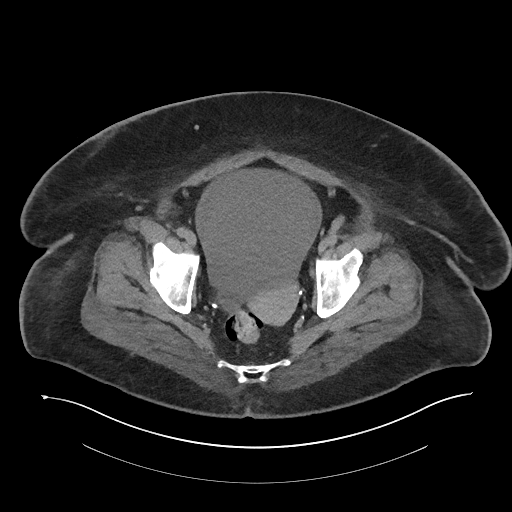
[im 41/58  soft-tissue]
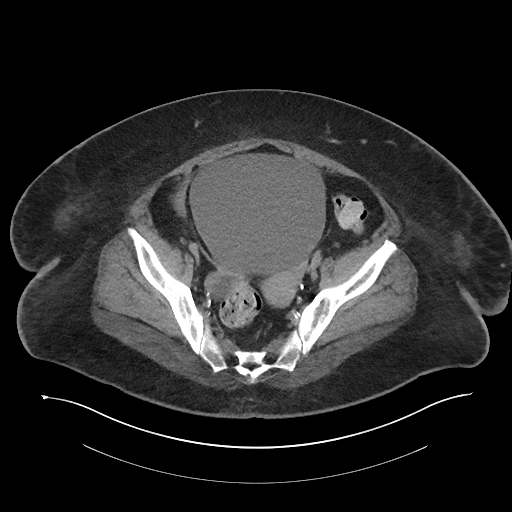
[im 41/58  bone]
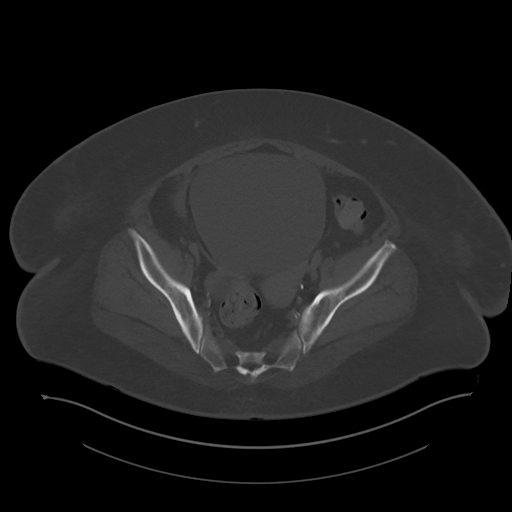
[im 45/58  soft-tissue]
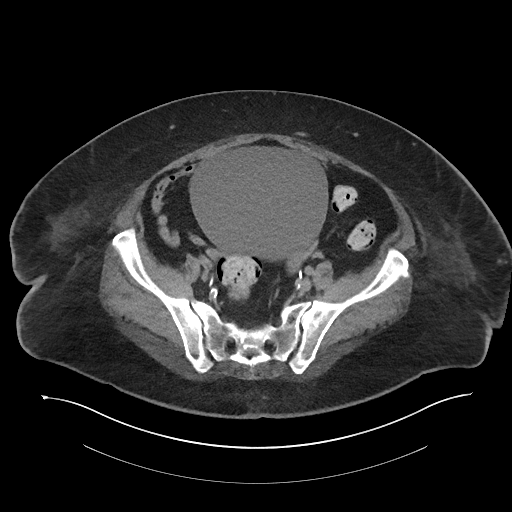
[im 50/58  soft-tissue]
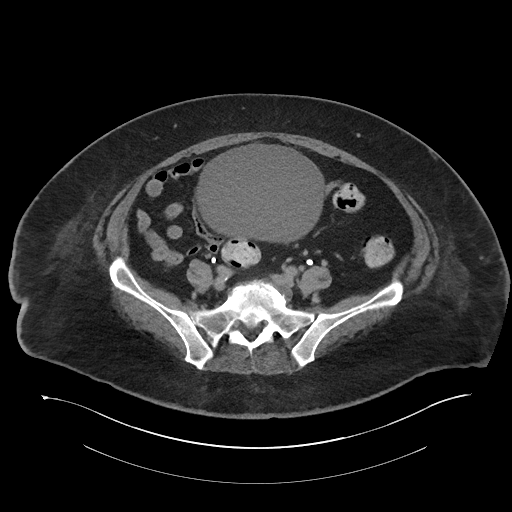
[im 54/58  soft-tissue]
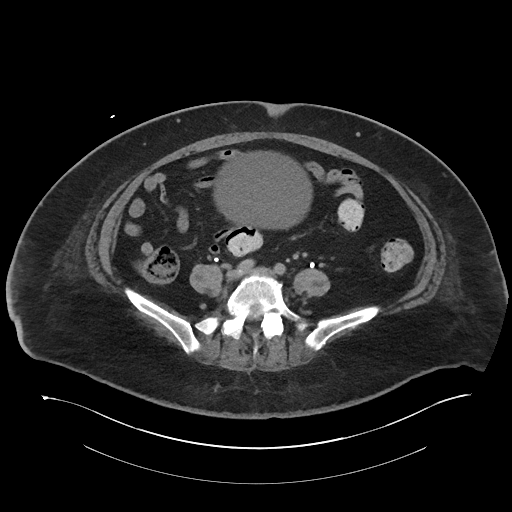

[Series 5: pelvis with 2.0 cor · coronal · 0.61mm/px · 3 of 151 slices shown]
[im 51/151  soft-tissue]
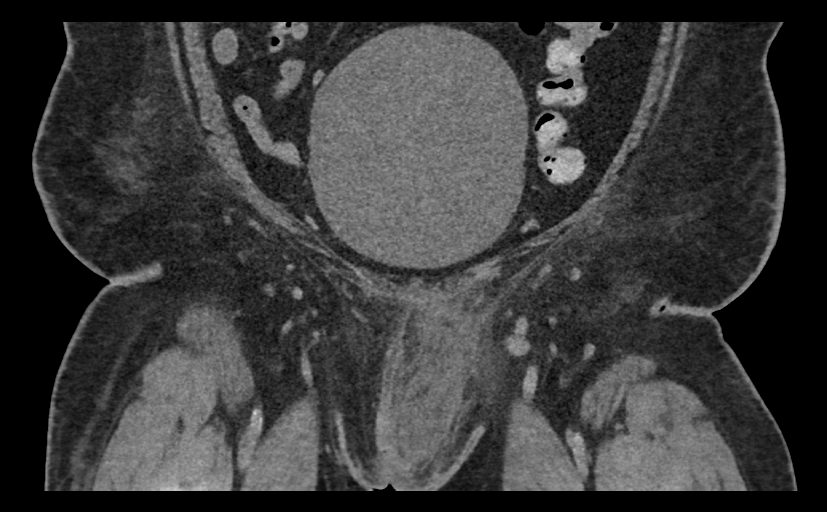
[im 67/151  soft-tissue]
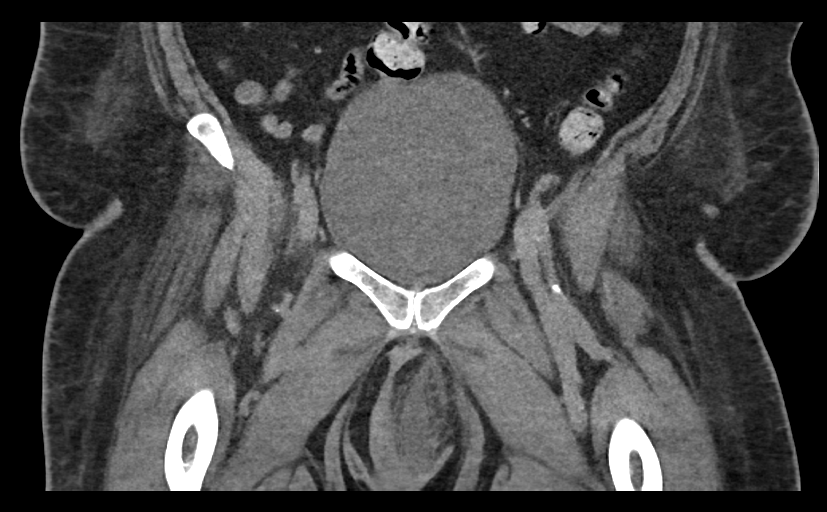
[im 84/151  soft-tissue]
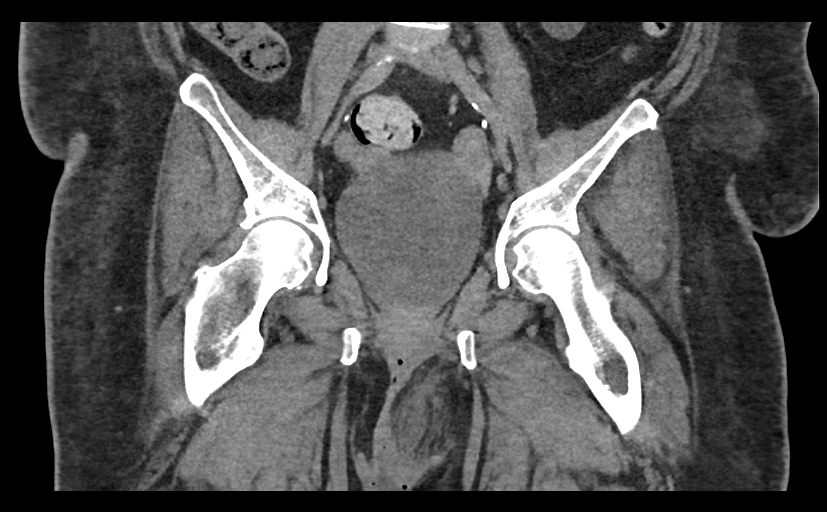

[15 of 46 positions shown; findings below may reference images not displayed]

FINDINGS: Urinary Tract: Visualized distal ureters are nondistended. Urinary
bladder physiologically distended.

Bowel: Visualized portions of small bowel and colon are nondilated,
unremarkable.

Vascular/Lymphatic: Scattered iliofemoral arterial calcifications
without aneurysm. Prominent bilateral inguinal lymph nodes as
before, presumably reactive. No pelvic adenopathy.

Reproductive: 3.9 cm right ovarian cyst. Uterus and left adnexal
region unremarkable.

Other:  No ascites.  No free air.

Musculoskeletal: Progressive extensive inflammatory/ edematous
changes and scattered subcutaneous fluid centered at the level of
the left labia, extending anteriorly through the mons into the
anterior pelvic body wall, and posteriorly into the left perineal
region. No discrete abscess. 4.5 x 1.3 cm low attenuation collection
in the region of the labia minora on the left, previously 4.4 x
cm. Ill-defined low attenuation collection previously noted at the
level of the vaginal introitus on the left is less conspicuous.

Facet DJD in the lower lumbar spine. No fracture or worrisome bone
lesion.
IMPRESSION: 1. Worsening changes of cellulitis centered at the level of the left
labia, without discrete or drainable abscess collection.
2. Stable cystic lesion in the region of the left labia minora.
3. Stable benign-appearing right ovarian cyst. Recommend follow-up
pelvic ultrasound in 6-12 months. This recommendation follows ACR
consensus guidelines: White Paper of the ACR Incidental Findings
Committee II on Adnexal Findings. [HOSPITAL] [DATE].

## 2020-09-22 DIAGNOSIS — F17210 Nicotine dependence, cigarettes, uncomplicated: Secondary | ICD-10-CM | POA: Insufficient documentation

## 2020-09-22 DIAGNOSIS — E1169 Type 2 diabetes mellitus with other specified complication: Secondary | ICD-10-CM | POA: Insufficient documentation

## 2020-09-22 DIAGNOSIS — E1142 Type 2 diabetes mellitus with diabetic polyneuropathy: Secondary | ICD-10-CM | POA: Insufficient documentation

## 2020-11-07 DIAGNOSIS — E872 Acidosis, unspecified: Secondary | ICD-10-CM | POA: Insufficient documentation

## 2020-11-07 DIAGNOSIS — L03116 Cellulitis of left lower limb: Secondary | ICD-10-CM | POA: Insufficient documentation

## 2020-11-07 DIAGNOSIS — N179 Acute kidney failure, unspecified: Secondary | ICD-10-CM | POA: Insufficient documentation

## 2020-11-07 DIAGNOSIS — Z7901 Long term (current) use of anticoagulants: Secondary | ICD-10-CM | POA: Insufficient documentation

## 2020-11-07 DIAGNOSIS — B3741 Candidal cystitis and urethritis: Secondary | ICD-10-CM | POA: Insufficient documentation

## 2020-11-07 DIAGNOSIS — E1165 Type 2 diabetes mellitus with hyperglycemia: Secondary | ICD-10-CM | POA: Insufficient documentation

## 2020-11-07 DIAGNOSIS — G4733 Obstructive sleep apnea (adult) (pediatric): Secondary | ICD-10-CM | POA: Insufficient documentation

## 2020-11-07 DIAGNOSIS — N3001 Acute cystitis with hematuria: Secondary | ICD-10-CM | POA: Insufficient documentation

## 2020-12-11 DIAGNOSIS — Z6841 Body Mass Index (BMI) 40.0 and over, adult: Secondary | ICD-10-CM | POA: Insufficient documentation

## 2020-12-11 DIAGNOSIS — R079 Chest pain, unspecified: Secondary | ICD-10-CM | POA: Insufficient documentation

## 2021-01-31 DIAGNOSIS — G459 Transient cerebral ischemic attack, unspecified: Secondary | ICD-10-CM | POA: Diagnosis not present

## 2021-02-27 DIAGNOSIS — I6522 Occlusion and stenosis of left carotid artery: Secondary | ICD-10-CM | POA: Insufficient documentation

## 2023-01-02 DIAGNOSIS — M6281 Muscle weakness (generalized): Secondary | ICD-10-CM | POA: Insufficient documentation

## 2023-01-02 DIAGNOSIS — L089 Local infection of the skin and subcutaneous tissue, unspecified: Secondary | ICD-10-CM | POA: Insufficient documentation

## 2023-01-02 DIAGNOSIS — K219 Gastro-esophageal reflux disease without esophagitis: Secondary | ICD-10-CM | POA: Insufficient documentation

## 2023-01-07 DIAGNOSIS — M6250 Muscle wasting and atrophy, not elsewhere classified, unspecified site: Secondary | ICD-10-CM | POA: Insufficient documentation

## 2023-01-27 DIAGNOSIS — L089 Local infection of the skin and subcutaneous tissue, unspecified: Secondary | ICD-10-CM | POA: Insufficient documentation

## 2023-01-27 DIAGNOSIS — L02612 Cutaneous abscess of left foot: Secondary | ICD-10-CM | POA: Insufficient documentation

## 2023-02-01 NOTE — Progress Notes (Signed)
-------------------------------------------------------------------------------   Attestation signed by Luwanna Caretha Anis, MD at 02/02/23 [phone] I saw and evaluated the patient, participating in the key portions of the service.  I reviewed the resident's note.  I agree with the resident's findings and plan.   PHEBE Anis Luwanna MD  -------------------------------------------------------------------------------  [facility] Progress Note  Admit Date: 01/27/2023, Hospital Day: 6 Hospital Service: Surg Vascular ([facility]) Attending: Caretha Anis Luwanna, MD  Assessment  Patient is a 63 y.o. female with diabetes and PAD who presented with wet gangrene of her left lateral foot with evidence of a gas-forming infection. She is s/p debridement of wound on 01/27/23, toe amp 01/28/23 on broad spectrum antibiotics.  Interval Events/Subjective: NAEON. Continues to be disoriented, however expressing understanding of overall situation with her chronic wound. Able to engage in conversation, although content is confused. No PRNs needed overnight. Overall, significant improvement day over day.  Continued 1:1 sitter. VSS on RA. Pain controlled. Patient refused all medications yesterday except lovenox , unasyn, and mag. To OR today for podiatry procedure.  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 - Afebrile, WBC wnl - Consult to ID for abx, appreciate recs:  - Continue Unasyn 3gm IV  Q6H - Place PICC  - ESR and CRP weekly; next 1/20   #DVT PPx - Lovenox    Endocrine: - NAI  Vascular: #Chronic non-healing L foot wounds - Podiatry c/s, appreciate recs  - S/p toe amp 1/13, plan for return to OR today (1/17) - Consult to WOCN - Left knee vashe wet to dry dressing changes daily per nursing   Disposition:  Floor status - PT/OT rec 5x low  Mercer Melena MD Vascular Surgery PGY2  ---------------------------------------------------------------------------  Vitals:  Temp:  [36.6 C (97.8 F)-36.9 C (98.5 F)] 36.9 C (98.5 F) Heart Rate:  [61-64] 64 Resp:  [17-18] 17 BP: (126-165)/(64-79) 165/79 MAP (mmHg):  [80-102] 102 SpO2:  [97 %] 97 %  Intake/Output last 24 hours:  Intake/Output Summary (Last 24 hours) at 02/01/2023 0847 Last data filed at 02/01/2023 0500 Gross per 24 hour  Intake 240 ml  Output 1600 ml  Net -1360 ml  Urine output light yellow.   Physical Exam: -General: Oriented to self and expresses understanding that something is wrong with her leg, otherwise disoriented, no acute distress. -Neurological: Moves all 4 extremities spontaneously. Mitts applied to upper extremities. Able to respond to conversation, although content is confused. -Cardiovascular: Regular rate. -Pulmonary: Normal work of breathing. No accessory muscle use. -Extremities: LLE dressing c/d/I, Knee with dressing c/d/i

## 2023-02-06 NOTE — Consults (Signed)
 Consult Note-Podiatry  Requesting Attending Physician :  Hannah Altamease Chant, MD Service Requesting Consult : podiatry  Assessment/Recommendations: Admission Diagnosis   Diagnosis ICD-10-CM Associated Orders  1. Foot infection  L08.9 Case request operating room: DEBRIDEMENT, BONE (INCLUDES EPIDERMIS, DERMIS, SUBCUTANEOUS TISSUE, MUSCLE AND/OR FASCIA, IF PERFORMED); FIRST 20 SQ CM OR LESS; FOOT, AMPUTATION, TOE; INTERPHALANGEAL JOINT    Case request operating room: DEBRIDEMENT, BONE (INCLUDES EPIDERMIS, DERMIS, SUBCUTANEOUS TISSUE, MUSCLE AND/OR FASCIA, IF PERFORMED); FIRST 20 SQ CM OR LESS; FOOT, AMPUTATION, TOE; INTERPHALANGEAL JOINT    Aerobic/Anaerobic Culture    Aerobic/Anaerobic Culture    Fungal (Mould) Pathogen Culture    Fungal (Mould) Pathogen Culture    AFB culture    AFB culture    Surgical pathology exam    Surgical pathology exam    AFB SMEAR    AFB SMEAR    Case request operating room: INCISION, BONE CORTEX, FOOT    Case request operating room: INCISION, BONE CORTEX, FOOT    Aerobic/Anaerobic Culture    Aerobic/Anaerobic Culture    Aerobic/Anaerobic Culture    Aerobic/Anaerobic Culture    Fungal (Mould) Pathogen Culture    Fungal (Mould) Pathogen Culture    Fungal (Mould) Pathogen Culture    Fungal (Mould) Pathogen Culture    Surgical pathology exam    Surgical pathology exam    Aerobic/Anaerobic Culture    Aerobic/Anaerobic Culture    Fungal (Mould) Pathogen Culture    Fungal (Mould) Pathogen Culture    Case request operating room: SECONDARY CLOSURE OF SURGICAL WOUND OR DEHISCENCE, EXTENSIVE OR COMPLICATED    Case request operating room: SECONDARY CLOSURE OF SURGICAL WOUND OR DEHISCENCE, EXTENSIVE OR COMPLICATED    2. Abscess of left foot  L02.612 Case request operating room: INCISION, BONE CORTEX, FOOT    Case request operating room: INCISION, BONE CORTEX, FOOT    Aerobic/Anaerobic Culture    Aerobic/Anaerobic Culture    Aerobic/Anaerobic Culture     Aerobic/Anaerobic Culture    Fungal (Mould) Pathogen Culture    Fungal (Mould) Pathogen Culture    Fungal (Mould) Pathogen Culture    Fungal (Mould) Pathogen Culture    Surgical pathology exam    Surgical pathology exam    Aerobic/Anaerobic Culture    Aerobic/Anaerobic Culture    Fungal (Mould) Pathogen Culture    Fungal (Mould) Pathogen Culture    Case request operating room: SECONDARY CLOSURE OF SURGICAL WOUND OR DEHISCENCE, EXTENSIVE OR COMPLICATED    Case request operating room: SECONDARY CLOSURE OF SURGICAL WOUND OR DEHISCENCE, EXTENSIVE OR COMPLICATED    Podiatry Diagnosis Left foot full-thickness ulceration dorsal Left foot fourth digit ischemic with toenail avulsion 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 Lowry on 01/28/23.   Necrotic plantar fascia with open wound.   Plan:   Patient will need debridement in the OR of the wound and plantar fascia. Depending on the quality of the tissue, will plan to close the majority of the wound. NPO after MN. Will try to get her on the scheduled tomorrow. Currently scheduled for 1/12.  Patient is in agreement.   Patient has had delirium since last procedure. Thought to be due to Cefepime. Now improved since discontinuation of cefepime on 1/15 and agreeable to conversation and dressing change. No expletives thrown. Would want husband to be on board for consent.  Psych was considered and not ultimately consulted.  Post op - will need xray, PT.OT, dressing changes, and likely SNF to help with mobility.   Recommend Vit D level. Start Vit C and MVit.     Patient was examined and evaluated. All findings discussed. Risk complication benefits alternative reviewed no promises or guarantees made. All questions entertained and answered. Surgical and non surgical intervention education given-questions  answered.   Allergies: Allergies  Allergen Reactions  . Cefepime Confusion  . Naproxen Nausea And Vomiting, Nausea Only and Other (See Comments)    Emotional Reaction  Cramps   Medications:   Current Facility-Administered Medications:  .  acetaminophen  (TYLENOL ) tablet 650 mg, 650 mg, Oral, Q4H PRN, Darji, Deepali, DPM, 650 mg at 01/30/23 2144 .  amlodipine  (NORVASC ) tablet 2.5 mg, 2.5 mg, Oral, Daily, Darji, Deepali, DPM, 2.5 mg at 02/06/23 0937 .  ampicillin-sulbactam (UNASYN) 3 g in sodium chloride  0.9 % (NS) 100 mL IVPB-MBP, 3 g, Intravenous, Q6H, Bascom Lamar HERO, MD, Stopped at 02/06/23 [phone] .  aspirin  chewable tablet 81 mg, 81 mg, Oral, Daily, Darji, Deepali, DPM, 81 mg at 02/06/23 0937 .  atorvastatin  (LIPITOR) tablet 80 mg, 80 mg, Oral, Daily, Darji, Deepali, DPM, 80 mg at 02/06/23 0936 .  bisacodyl (DULCOLAX) suppository 10 mg, 10 mg, Rectal, Daily PRN, Gutterman, Sophia, MD .  carbamide peroxide (DEBROX) 6.5 % otic solution 5 drop, 5 drop, Right Ear, BID PRN, Bascom Lamar HERO, MD, 5 drop at 02/06/23 0941 .  dextrose  (D10W) 10% bolus 125 mL, 12.5 g, Intravenous, Q10 Min PRN, Darji, Deepali, DPM .  docusate sodium  (COLACE) capsule 100 mg, 100 mg, Oral, Daily, Darji, Deepali, DPM, 100 mg at 02/05/23 0918 .  enoxaparin  (LOVENOX ) syringe 40 mg, 40 mg, Subcutaneous, Q24H SCH, Gutterman, Sophia, MD, 40 mg at 02/06/23 0936 .  furosemide (LASIX) tablet 40 mg, 40 mg, Oral, BID, Lombardi, Megan E, MD, 40 mg at 02/06/23 9061 .  gabapentin  (NEURONTIN ) capsule 300 mg, 300 mg, Oral, TID, Heckler, Robert M, MD, 300 mg at 02/06/23 9061 .  glucagon injection 1 mg, 1 mg, Intramuscular, Once PRN, Darji, Deepali, DPM .  glucose chewable tablet 16 g, 16 g, Oral, Q10 Min PRN, Darji, Deepali, DPM .  hydrALAZINE  (APRESOLINE ) injection 10 mg, 10 mg, Intravenous, Q3H PRN, Darji, Deepali, DPM .  insulin  lispro (HumaLOG) injection 0-20 Units, 0-20 Units, Subcutaneous, ACHS, Darji, Deepali, DPM, 2 Units at  02/06/23 9365 .  insulin  lispro (HumaLOG) injection 6 Units, 6 Units, Subcutaneous, 3xd Meals, Alona Consuelo Blunt, PA .  insulin  NPH (HumuLIN,NovoLIN) injection 9 Units, 9 Units, Subcutaneous, Q12H SCH, Alona Consuelo Blunt, PA, 4.5 Units at 02/06/23 0935 .  labetalol (NORMODYNE) injection, 10 mg, Intravenous, Q2H PRN, Darji, Deepali, DPM, 10 mg at 02/01/23 1619 .  metoPROLOL succinate (Toprol-XL) 24 hr tablet 50 mg, 50 mg, Oral, Daily, Darji, Deepali, DPM, 50 mg at 02/06/23 9061 .  oxyCODONE  (ROXICODONE ) immediate release tablet 2.5 mg, 2.5 mg, Oral, Q4H PRN, 2.5 mg at 02/05/23 1249 **OR** oxyCODONE  (ROXICODONE ) immediate release tablet 5 mg, 5 mg, Oral, Q4H PRN, Gutterman, Sophia, MD, 5 mg at 02/06/23 0937 .  pantoprazole (Protonix) EC tablet 40 mg, 40 mg, Oral, Daily before breakfast, Darji, Deepali, DPM, 40 mg at 02/06/23 0634 .  polyethylene glycol (MIRALAX ) packet 17 g, 17 g, Oral, Daily, Darji, Deepali, DPM, 17 g at 02/04/23 0826 .  QUEtiapine (SEROQUEL) tablet 50 mg, 50 mg, Oral, Nightly, Darji, Deepali, DPM, 50 mg at 02/05/23 2200  Medical History: Past Medical History:  Diagnosis Date  .  CHF (congestive heart failure) (CMS-HCC)   . Diabetes mellitus (CMS-HCC)   . Hypertension   . TIA (transient ischemic attack)    Surgical History: Past Surgical History:  Procedure Laterality Date  . PR AMPUTATION METATARSAL+TOE,SINGLE Left 01/28/2023   Procedure: AMPUTATION, METATARSAL, WITH TOE SINGLE;  Surgeon: Lowry Lovings, DPM;  Location: OR [facility];  Service: Vascular  . PR DEBRIDEMENT BONE 1ST 20 SQ CM/< Left 01/27/2023   Procedure: DEBRIDEMENT, BONE (INCLUDES EPIDERMIS, DERMIS, SUBCUTANEOUS TISSUE, MUSCLE AND/OR FASCIA, IF PERFORMED); FIRST 20 SQ CM OR LESS; FOOT;  Surgeon: Luwanna Hannah Anis, MD;  Location: [facility];  Service: Vascular  . PR DEEP INCIS FOOT BONE INFECTN Left 01/28/2023   Procedure: INCISION, BONE CORTEX, FOOT;  Surgeon: Lowry Lovings, DPM;  Location: OR  [facility];  Service: Vascular    Social History: Social History   Tobacco Use  . Smoking status: Every Day    Current packs/day: 1.00    Types: Cigarettes  . Smokeless tobacco: Never  Substance and Sexual Activity  . Alcohol use: Not Currently  . Drug use: Not Currently   Family History: History reviewed. No pertinent family history.  Review of Systems: neg outside c/c  Musculoskeletal:  Denies back pain or joint pain  Integument:  Denies rash  Neurologic:  Denies headache, focal weakness or sensory changes   BP 154/70   Pulse 66   Temp 36.3 C (97.3 F) (Oral)   Resp 17   Ht 167.6 cm (5' 6)   Wt 95.1 kg (209 lb 10.5 oz)   SpO2 98%   BMI 33.84 kg/m   Subjective:  Hannah [name] is a 63 y.o. female with pmh of CHF, DM, HTN, TIA; presenting via EMS with left foot wounds and falls. Pt states her pain has lasted 3-4 weeks. She has taken Tramadol  for pain abd Tylenol  intermittently. Pt sees Wound Care in [place], but hasn't seen them in 4 weeks, but has had nursing care for her wound. Pt was in rehab in [place] and was discharged yesterday due to her insurance coverage running out. Pt states she is concerned that she was discharged too early. Pt states that after she was discharged, she was unable to walk due to foot pain. Pt has had 3 falls today at home   She was previously told that she has a bone infection, which has since completely gone away, states she almost lost her big toe.    On nursing triage exam, breast lesions were noted, patient reports she does not have concern regarding these wounds.   By chart review: Last admission found was in 2023 on care everywhere, patient admitted to vascular surgery at [facility]  01/28/23 63 year old female who had an I&D with vascular yesterday 01/27/2023 for gas gangrene to the left foot from the fourth metatarsal wound and the fifth metatarsal wound down to the plantar vault. Today podiatry was consulted for further information and  evaluation. Patient continues to have pain upon palpation further pus was expressed bedside from the medial plantar vault at this time it was deemed emergency to take the patient back to the operating room for surgical intervention for further I&D and amputation of the ischemic changes or the dark dusky nature of the fourth toe. Patient did not seem alert and orientated x 3 she was alert and orientated x 1 her husband Hannah [name] was able to give us  verbal consent to move forward for the surgery risk complications benefits alternatives reviewed no promises or guarantees made.  1/21 Patient is doing better.  Has pain in the heel and ankle area. Minimal pain with dressing change. Wants to try to save the foot. Appreciative of the staff.   Explains that prior to this admission that she wasn't able to take care of herself. Her husband helped get shoes and pants on. She fell several times and required Firefighters to come and get her up. She can transfer ok but not walk far. One SNF did not help with PT/OT much at all.  Feeling very weak now - difficulty moving body and legs in the bed.   Physical Exam: General: Patient presents to clinic: alone Alert and oriented to place time and person.   Vascular:  Lightly palpable 1 out of 4 bilaterally Foot is warm and well perfused for external skin  Skin temperature of the lower extremities is warm to warm, proximal tibial tuberosity to distal digits B/L.   Hair growth absent on digits B/L.  Left foot plantar vault is warm Left foot edema minimal   Neurologic:  Light touch sensation intact B/L. Epicritic Sensation intact B/L. Tender to heel and ankle   Dermatologic:  ULCER Examination Open wound from 4th toe amputation site across to medial arch just proximal to heel.  Left foot full-thickness ulceration fifth metatarsal head Left foot incisions from surgery open and draining - no purulence.  Left foot incisions show necrosis of plantar fascia.  Has fibrin of other portions of the open wound but no sign of worsening infection.  No erythema at this time.  Does have erythema proximal to the ankle - proximal to level of ACE bandage.   Musculoskeletal:  Left 4th toe amputation  Test Results Imaging:  reviewed

## 2023-02-11 NOTE — Consults (Signed)
 Endocrine Team Diabetes Follow Up Consult Note   Consult information: Requesting Attending Physician : [name], MD Service Requesting Consult : Surg Vascular (SRV) Primary Care Provider: [name], MD Impression: Hannah [name] is a 63 y.o. female admitted for wet gangrene of her left lateral foot with evidence of a gas-forming infection. We have been consulted at the request of [name], MD to evaluate Hannah [name] for hyperglycemia.   Medical Decision Making: Diagnoses: 1.Type 2 Diabetes. Uncontrolled With hyperglycemia. 2. Nutrition: Complicating glycemic control. Increasing risk for both hypoglycemia and hyperglycemia. 3. Infection. Complicating glycemic control and increasing risk for hyperglycemia. 4. Obesity. Complicating glycemic control and increasing risk for hyperglycemia.   Studies reviewed 02/11/23: Labs: POCT-BG Interpretation: Intermittent hyperglycemia. 24 hr BG range 125-225 mg/dL.  Notes reviewed: Primary team and nursing notes   Overall impression based on above reviews and history: Patient with longstanding T2DM, moderately controlled outpatient on novolog  correction alone. Blood sugars now improving with steroids off but fasting BG trending at the upper limit of goal range. Will increase basal insulin  by 10% today and continue to monitor closely.   Recommendations: - Increase NPH to 10 units q12  - Continue lispro 6u TIDAC  - Continue Lispro correction 1:30>140 qACHS - Hypoglycemia protocol. - POCT-BG achs. - Ensure patient is on glucose precautions if patient taking nutrition by mouth.   Discharge planning: Based on current inpatient insulin  requirements and A1c of 8.8%, will plan to discharge patient on basal and correctional insulin  plus a DPP4 inhibitor as follows: - Lantus  20 units at bedtime (roughly 0.2 units/kg) - Novolog  correction 2:50>150 qAC - Linagliptin 5 mg daily  - Follow up with PCP in 2-4 weeks  Thank  you for this consult. Discussed plan with primary team. We will continue to follow and make recommendations and place orders as appropriate.  Please page with questions or concerns: Consuelo Marsa Bellman, [place]: [phone] Endocrinology Diabetes Care Team on call from 6AM - 3PM on weekdays then endocrine fellow on call: [phone] from 3PM - 6AM on weekdays and on weekends and holidays.  If APP cannot be reached, please page the endocrine fellow on call.   Subjective: Interval History: Patient stable overnight. C/o intermittent pain in her L foot. Denies N/V. Tolerating PO diet. Now pending SNF placement.   Initial HPI: Hannah [name] is a 63 y.o. female with past medical history of diabetes and PAD who presented with wet gangrene of her left lateral foot with evidence of a gas-forming infection. She is s/p debridement of wound on 01/27/23, toe amp 01/28/23 on broad spectrum antibiotics.    Diabetes History: Patient has a history of Type 2 diabetes diagnosed 30+ years ago. Diabetes is managed by: PCP. Current home diabetes regimen: States that she is currently only using novolog  correction as needed outpatient. She has been on Lanuts, up to 70 units daily, Ozempic, and Januvia in the past. Reports stopping basal insulin  due to hypoglycemia. States that she did not tolerate ozempic well due to N/V and Januvia is no longer covered by her insurance.  Current home blood glucose monitoring: Freestyle libre. Hypoglycemia awareness: yes, reports feeling symptoms in the 90s. Complications related to diabetes: peripheral neuropathy   Current Nutrition: Active Orders  Diet   Nutrition Therapy Regular/House    ROS: As per HPI.  . acetaminophen   1,000 mg Oral Q8H  . amlodipine   2.5 mg Oral Daily  . ampicillin-sulbactam  3 g Intravenous Q6H  . aspirin   81 mg Oral  Daily  . atorvastatin   80 mg Oral Daily  . docusate sodium   100 mg Oral Daily  . enoxaparin  (LOVENOX ) injection  40 mg Subcutaneous Q24H SCH   . furosemide  40 mg Oral BID  . gabapentin   300 mg Oral TID  . insulin  lispro  0-20 Units Subcutaneous ACHS  . insulin  lispro  6 Units Subcutaneous 3xd Meals  . insulin  NPH  9 Units Subcutaneous Q12H SCH  . metoPROLOL succinate  50 mg Oral Daily  . pantoprazole  40 mg Oral Daily before breakfast  . polyethylene glycol  17 g Oral Daily  . QUEtiapine  50 mg Oral Nightly  . spironolactone  25 mg Oral Daily    Current Outpatient Medications  Medication Instructions  . amlodipine  (NORVASC ) 2.5 mg, Daily (standard)  . atorvastatin  (LIPITOR) 80 mg, Daily (standard)  . clopidogrel  (PLAVIX ) 75 mg, Daily (standard)  . furosemide (LASIX) 40 mg, 2 times a day (standard)  . gabapentin  (NEURONTIN ) 600 mg, 4 times a day  . losartan  (COZAAR ) 50 mg, Daily (standard)  . metoPROLOL succinate (TOPROL-XL) 50 mg, Daily (standard)  . pantoprazole (PROTONIX) 40 mg, Daily before breakfast  . QUEtiapine (SEROQUEL) 50 mg, Nightly  . spironolactone (ALDACTONE) 25 mg, Daily (standard)  . traMADol  (ULTRAM ) 50 mg, 2 times a day      Past Medical History:  Diagnosis Date  . CHF (congestive heart failure) (CMS-HCC)   . Diabetes mellitus (CMS-HCC)   . Hypertension   . TIA (transient ischemic attack)     Past Surgical History:  Procedure Laterality Date  . PR AMPUTATION METATARSAL+TOE,SINGLE Left 01/28/2023   Procedure: AMPUTATION, METATARSAL, WITH TOE SINGLE;  Surgeon: Lowry Lovings, DPM;  Location: OR [facility];  Service: Vascular  . PR DEBRIDEMENT BONE 1ST 20 SQ CM/< Left 01/27/2023   Procedure: DEBRIDEMENT, BONE (INCLUDES EPIDERMIS, DERMIS, SUBCUTANEOUS TISSUE, MUSCLE AND/OR FASCIA, IF PERFORMED); FIRST 20 SQ CM OR LESS; FOOT;  Surgeon: Luwanna Caretha Anis, MD;  Location: OR [facility];  Service: Vascular  . PR DEEP INCIS FOOT BONE INFECTN Left 01/28/2023   Procedure: INCISION, BONE CORTEX, FOOT;  Surgeon: Lowry Lovings, DPM;  Location: OR [facility];  Service: Vascular  . PR SECONDARY CLOSURE SURG  WOUND/DEHSN XTNSV/COMP Left 02/06/2023   Procedure: SECONDARY CLOSURE OF SURGICAL WOUND OR DEHISCENCE, EXTENSIVE OR COMPLICATED;  Surgeon: Lowry Lovings, DPM;  Location: OR [facility];  Service: Vascular    History reviewed. No pertinent family history.  Social History   Tobacco Use  . Smoking status: Every Day    Current packs/day: 1.00    Types: Cigarettes  . Smokeless tobacco: Never  Substance Use Topics  . Alcohol use: Not Currently  . Drug use: Not Currently    OBJECTIVE: BP 141/69   Pulse 65   Temp 36.8 C (98.2 F) (Oral)   Resp 18   Ht 167.6 cm (5' 6)   Wt 91.9 kg (202 lb 9.6 oz)   SpO2 98%   BMI 32.70 kg/m  Wt Readings from Last 12 Encounters:  02/09/23 91.9 kg (202 lb 9.6 oz)  01/26/23 100.2 kg (221 lb)   Physical Exam Vitals and nursing note reviewed.  Constitutional:      General: She is not in acute distress.    Appearance: She is obese.     Comments: Lying in bed  Pulmonary:     Effort: Pulmonary effort is normal. No respiratory distress.  Skin:    General: Skin is warm and dry.     Comments: LLE dressing  c/d/i  Neurological:     Mental Status: She is alert and oriented to person, place, and time.     BG/insulin  reviewed per EMR.  Glucose, POC (mg/dL)  Date Value  98/72/7974 179  02/10/2023 152  02/10/2023 146  02/10/2023 125  02/09/2023 191 (H)  02/09/2023 152  02/09/2023 158  02/09/2023 172     Summary of labs: Lab Results  Component Value Date   A1C 8.8 (H) 02/05/2023   Lab Results  Component Value Date   CREATININE 1.34 (H) 02/07/2023   Lab Results  Component Value Date   WBC 9.0 02/11/2023   HGB 10.5 (L) 02/11/2023   HCT 31.7 (L) 02/11/2023   PLT 271 02/11/2023    Lab Results  Component Value Date   NA 140 02/07/2023   K 4.5 02/07/2023   CL 98 02/07/2023   CO2 30.0 02/07/2023   BUN 26 (H) 02/07/2023   CREATININE 1.34 (H) 02/07/2023   GLU 232 (H) 02/07/2023   CALCIUM  9.3 02/07/2023   MG 2.0 02/07/2023   PHOS 3.8  02/07/2023    Lab Results  Component Value Date   BILITOT 0.2 (L) 02/05/2023   BILIDIR <0.10 02/05/2023   PROT 6.9 02/05/2023   ALBUMIN 2.4 (L) 02/05/2023   ALT 27 02/05/2023   AST 26 02/05/2023   ALKPHOS 91 02/05/2023

## 2023-04-12 DIAGNOSIS — L732 Hidradenitis suppurativa: Secondary | ICD-10-CM | POA: Insufficient documentation

## 2023-04-12 DIAGNOSIS — B372 Candidiasis of skin and nail: Secondary | ICD-10-CM | POA: Insufficient documentation

## 2023-05-07 DIAGNOSIS — M86672 Other chronic osteomyelitis, left ankle and foot: Secondary | ICD-10-CM | POA: Insufficient documentation

## 2023-05-07 DIAGNOSIS — F17210 Nicotine dependence, cigarettes, uncomplicated: Secondary | ICD-10-CM | POA: Insufficient documentation

## 2023-07-03 NOTE — Progress Notes (Signed)
 [facility] INFECTIOUS DISEASES CLINIC NEW PATIENT EVALUATION   Hannah [name] is being seen in consultation at the request of Deepali Darji for evaluation of foot osteomyelitis.  Assessment/Recommendations: 63 year old woman with diabetes and peripheral vascular disease, as well as chronic renal insufficiency who underwent 4th and 5th ray partial amputation in early April for chronic osteomyelitis.  The clean bone margin was sterile, however the histologic examination showed chronic osteomyelitis at the resection margin.  The patient then received 2-week course of empiric doxycycline  that ended over a month ago.  The plans for revascularization of the leg has been discontinued, likely indefinitely due to chronic renal insufficiency.  Luckily however, the foot wound is now showing good signs of healing and she has no clear signs or symptoms of ongoing infection.  She certainly is at some risk of having residual infected bone and I am not 100% certain that she has been cured.  However, if she continues to have good local wound healing and no other signs of residual infected bone, then I think no further workup or treatment is needed.  We can check baseline markers of inflammation now, but would not plan on any interventions at this point.  ID Problem List: 1.  Chronic osteomyelitis of left foot, status post fifth ray amputation 2.  Peripheral vascular disease 3.  Chronic renal insufficiency  Recommendations: 1.  Check markers of inflammation today as baseline. 2.  Defer any further treatment or imaging currently but continue clinical monitoring. 3.  If patient develops signs of ongoing infection: Local signs of inflammation, drainage, poor wound healing, systemic symptoms, would reevaluate for osteomyelitis treatment.  Recommendations communicated via shared medical record.  Disposition: Return to clinic if needed.   History of Present Illness:   Hannah [name] is a 63 y.o. female with a history of  diabetes CKD, left foot abscess who was taken to the operating room most recently on 4/4 by podiatry where she underwent left foot fifth ray amputation and bone biopsy of the left fifth metatarsal.  Dirty bone grew mixed gram-positive and gram-negative organisms with no growth from clean bone.  Pathology showed chronic osteomyelitis at the resection margin.  She seems to have been closely followed by podiatry post amputation and was seen in clinic on 4/22 where they noted open ulceration with exposed bone and mild cellulitis with significant drainage and poor healing.  She was prescribed doxycycline  for 14 days at that appointment.  She has also been followed by vascular surgery with plan for angiogram and possible intervention to occur on 6/19.  Plan for angiogram was discontinued when patient's chronic renal insufficiency was discovered.  Patient was reevaluated in podiatry clinic with less concern for poor wound healing and no concerns for local signs of infection.  The patient denies any drainage, swelling, redness, tenderness, warmth, streaking, fevers, chills, sweats.   Allergies: Cefepime and Naproxen  Medications:  Current antibiotics: None  Previous antibiotics: Doxycycline  4/22-/6  Current/Prior immunomodulators: None  Other medications reviewed.   Medical History: Past Medical History[1]  Surgical History: Past Surgical History[2]  Social History: Tobacco use:   reports that she has been smoking cigarettes. She started smoking about 20 years ago. She has a 30.5 pack-year smoking history. She has never used smokeless tobacco.  Alcohol use:    reports that she does not currently use alcohol.  Drug use:    reports that she does not currently use drugs.  Living situation:  Lives in a nursing facility  Family History: Family History[3]  Immunizations: Immunization History  Administered Date(s) Administered  .  COVID-19 VACC,(JANSSEN)(PF) 05/17/2019    Review of Systems: 10 systems reviewed and negative except as per HPI.   Objective  Vital Signs: BP 132/67 (BP Site: L Arm, BP Position: Sitting, BP Cuff Size: Medium)   Pulse 69   Temp 36.4 C (97.6 F) (Temporal)   Ht 172.7 cm (5' 8)   Wt 94.4 kg (208 lb 3.2 oz)   BMI 31.66 kg/m   Physical Exam:  General appearance:  looks well, no apparent distress Skin:  no petechiae, ecchymoses or obvious rashes on clothed exam MSK:  Left foot status post 4th and 5th ray amputation with eschar present over surgical scar with no warmth, erythema, fluctuance, tenderness, discharge, swelling.  See photo in media tab.  Labs:     Serologies: No results found for: CMVIGG, EBVIGG, HIVAGAB, HEPAIGG, HEPBSAG, HEPBSAB, HEPBCAB, HEPCAB, LABRPR, HSV1IGG, HSV2IGG, VZVIGG, RUBIG, TOXOIGG, QTBG, QFTTBAG, QFTNILVALUE, QFTMITOGEN, QFTAGMINNIL, COCCIDIOAB, COCCABG  Microbiology:     Studies:    Pathology: Surgical pathology exam: [id number] Order: [id number]  Collected 04/19/2023 08:14     Status: Final result   Test Result Released: No   0 Result Notes     Component Ref Range & Units (hover)  Resulting Agency  Diagnosis  A: Left foot fourth and fifth bone dirty, amputation - Diffuse gangrenous necrosis of skin and soft tissues - Chronic osteomyelitis   B: Left foot fourth and fifth bone clean, amputation - Chronic osteomyelitis, present at the resection margin   This electronic signature is attestation that the pathologist personally reviewed the submitted material(s) and the final diagnosis reflects that evaluation.  Electronically signed by LEVEDA KATHEE STABS, MD on 04/30/2023 at 1722 EDT  Clinical History  [facility] MCL <redacted file path>  Left foot dry gangrene and necrosis  Left foot fifth ray osteomyelitis  Left foot full-thickness ulceration  Previous amputation of left foot fourth toe  Gross Description   [facility] MCL <redacted file path>  A.  Left foot fourth and fifth bone, dirty, consists of a single digit, which appears disarticulated at the metatarsal-phalangeal joint.  The skin and soft tissue margins are inked blue.  The distal aspect of the digit is circumferentially mummified and necrotic (3 cm maximum length),, which appears to extend to the margin on the plantar aspect.  On cut sections the distal digit is hemorrhagic, involving the underlying bone.  Sectioning demonstrates 3 phalangeal bones of a single digit; no additional, adjacent bones are identified. Block summary: A1: Representative distal digit with mummification and involved bone, following decalcification  A2: Representative plantar surface skin and soft tissue margin perpendicular A3: Representative proximal bone disarticulation site, perpendicular, following decalcification Tissue remains B.  Left foot fourth and fifth bone, clean, consists of a single amputated metatarsal with surrounding skin and soft tissue (6 x 2.5 x 2 cm overall).  The bone margin is inked blue and the remaining soft tissue/skin is inked black.  The surrounding soft tissue is firm and fibrotic.  The overlying skin is unremarkable.  The proximal margin is ragged and is grossly noncircumferential. Block summary: B1: Proximal bone margin en face, following decalcification B2: Representative metatarsal head, soft tissue, skin, following decalcification, tissue remains     [place])  Microscopic Description  [facility] MCL <redacted file path>  Microscopic examination substantiates the above diagnosis.      Redell Sloop MD  [facility]  [address], 1st floor  [place], [place]. [address]  Phone: [phone]  Fax: [phone]          [1] Past Medical History: Diagnosis Date  . CHF (congestive heart failure)      . CKD (chronic kidney disease)   . Diabetes mellitus      . Hypertension   . TIA (transient ischemic  attack)   [2] Past Surgical History: Procedure Laterality Date  . PR AMPUTATION METATARSAL+TOE,SINGLE Left 01/28/2023   Procedure: AMPUTATION, METATARSAL, WITH TOE SINGLE;  Surgeon: Lowry Lovings, DPM;  Location: OR [facility];  Service: Vascular  . PR AMPUTATION METATARSAL+TOE,SINGLE Left 04/19/2023   Procedure: AMPUTATION, METATARSAL, WITH TOE SINGLE;  Surgeon: Lowry Lovings, DPM;  Location: [facility];  Service: Vascular  . PR DEBRIDEMENT BONE 1ST 20 SQ CM/< Left 01/27/2023   Procedure: DEBRIDEMENT, BONE (INCLUDES EPIDERMIS, DERMIS, SUBCUTANEOUS TISSUE, MUSCLE AND/OR FASCIA, IF PERFORMED); FIRST 20 SQ CM OR LESS; FOOT;  Surgeon: Luwanna Caretha Anis, MD;  Location: OR [facility];  Service: Vascular  . PR DEEP INCIS FOOT BONE INFECTN Left 01/28/2023   Procedure: INCISION, BONE CORTEX, FOOT;  Surgeon: Lowry Lovings, DPM;  Location: OR [facility];  Service: Vascular  . PR SECONDARY CLOSURE SURG WOUND/DEHSN XTNSV/COMP Left 02/06/2023   Procedure: SECONDARY CLOSURE OF SURGICAL WOUND OR DEHISCENCE, EXTENSIVE OR COMPLICATED;  Surgeon: Lowry Lovings, DPM;  Location: OR [facility];  Service: Vascular  . PR SECONDARY CLOSURE SURG WOUND/DEHSN XTNSV/COMP Left 04/19/2023   Procedure: SECONDARY CLOSURE OF SURGICAL WOUND OR DEHISCENCE, EXTENSIVE OR COMPLICATED;  Surgeon: Lowry Lovings, DPM;  Location: [facility];  Service: Vascular  [3] No family history on file.

## 2023-07-27 NOTE — Progress Notes (Signed)
 ------------------------------------------------------------------------------- Attestation signed by Debarah Lang Manis, MD at 08/06/23 1537 I saw and evaluated the patient, participating in the key portions of the service.  I reviewed this note.  I agree with the findings and plan.   Wounds are healing.  She is at very high risk for morbidity if she undergoes a procedure.  She will follow-up as needed.   I spent 20 minutes reviewing images and during history, physical exam, and discussion.  Jacob C Wood, MD Assistant Professor Division of Vascular Surgery  -------------------------------------------------------------------------------    VASCULAR SURGERY [facility]  Patient Name: Hannah [name] Encounter Date: 07/31/2023 Referring provider: Curcio Surgeon: Lang JAYSON Debarah, MD  ASSESSMENT   63 y.o. female with history of BLE PAD with LLE CLTI for rest pain/tissue loss. Patient states that her wound continues to heal. Duplex demonstrates Right ABIs 1.06 ATA Great Toe 120 mmHg, left ABI 0.55 and toe pressure is 96 mmHg, she should have adequate perfusion to heal the wound.  Given her comorbidities would not recommend any further routine surveillance at this time given her cardiologist has stated she is contraindicated for any intervention. Should she fail to heal or have new concerns then will have podiatry team re-engage us .  PLAN   Follow up in as needed. Return precautions reviewed.   Continue with aggressive wound care management of foot wound per podiatry team Patient to upload recent picture to chart for podiatry  team Medical optimization Statin: Continue atorvastatin  80 mg daily Smoking cessation: Patient counseled on the importance of smoking cessation.  Antiplatelet therapy: on asa 81 mg and plavix  75 mg  BP and diabetic management per PCP. Goal A1c <7%. Last A1c was 8.8 on January 2025. Primary team should consider referral to endocrinology.  HISTORY OF PRESENT ILLNESS     Hannah [name] is a 63 y.o. female with history of CHF, DM, HTN, Carotid stenosis hx of TIA s/p left TCAR 2023 (follows with Dr. Valma), prior history of a DVT (previously anticoagulated discontinued many years ago), hyperlipidemia, GERD, CAD (s/p cardiac cath 2022 with significant multivessel coronary artery disease), who presents for follow up of nonhealing left fifth ray amputation.  She had been scheduled twice for an angiogram the first was canceled 2/2 groin infection, the second canceled by cardiology for [facility] and renal disease. Today, patient states that she thinks the wound is healing well but there is an an infection but states that this has been evaluated by the team at the SNF and is on doxycyline. She has not called podiatry team to let them know. She has complex dressing and does not want to take those down and states that she has taken a picture today. She denies any fevers, chills or any significant drainage from the extremity.  She continues to smoke and smokes approximately 10 cigarettes/day. She is working with cardiology on CHF and is elevating the leg throughout the day.  Interventions to date:  LTCAR: Dr. Valma, 2023 01/27/2023: left foot abscess incision and drainage, excisional wound debridement by Dr. Luwanna  01/28/2023 Left foot incision and drainage, fourth toe amp by Dr. Lowry -  02/06/2023: left foot incision and drainage, plantar fasciectomy, syndactylization between firth and third toe, with delayed closure by Dr. Lowry  03/19/2023: aborted angiogram due to right groin infection  04/19/2023: left fifth ray amp, delayed closure, skin substitute application by Dr. Lowry   ROS: The balance of 10 systems reviewed is negative except as noted in the HPI   RX/ ALLERGIES/ MED  HX/SURG HX/ SOC HX/FAM HX: Reviewed & updated in Epic  Current Outpatient Medications  Medication Instructions  . acetaminophen  (TYLENOL ) 1,000 mg, Oral, Every 8 hours  . amlodipine  (NORVASC ) 2.5  mg, Daily (standard)  . ascorbic acid (vitamin C) (ASCORBIC ACID) 500 mg, Daily (standard)  . aspirin  81 mg, Oral, Daily (standard)  . atorvastatin  (LIPITOR) 80 mg, Daily (standard)  . bisoprolol  (ZEBETA ) 2.5 mg, Daily (standard)  . blood sugar diagnostic (GLUCOSE BLOOD) Strp Use to check blood sugar as directed with insulin  3 times a day & for symptoms of high or low blood sugar.  . blood-glucose meter kit Use as instructed  . clopidogrel  (PLAVIX ) 75 mg, Daily (standard)  . dextrose  40 % gel 15 g of dextrose , As needed during procedure (one-step-med)  . doxycycline  (VIBRA -TABS) 100 mg, 2 times a day (standard)  . famotidine  (PEPCID ) 20 mg, 2 times a day (standard)  . ferrous sulfate  325 mg, Daily  . furosemide (LASIX) 40 mg, Oral, Daily (standard)  . gabapentin  (NEURONTIN ) 300 mg, Oral, 3 times a day (standard)  . glucagon solution 1 mg SolR As needed during procedure (one-step-med)  . HYDROcodone-acetaminophen  (NORCO) 5-325 mg per tablet 1 tablet, Every 6 hours PRN  . insulin  glargine (BASAGLAR , LANTUS ) 24 Units, Subcutaneous, Nightly  . insulin  lispro (HUMALOG) 100 unit/mL injection 3 times a day (AC)  . lancets Misc Use to check blood sugar as directed with insulin  3 times a day & for symptoms of high or low blood sugar.  . linagliptin (TRADJENTA) 5 mg, Oral, Daily (standard)  . meclizine (ANTIVERT) 25 mg, 3 times a day PRN  . metOLazone (ZAROXOLYN) 2.5 MG tablet   . NON FORMULARY Skilled nursing facility dressing change recommendation prescription Left lower extremity should be completely washed every day with wound cleanser Dry the area with a dry gauze Apply Iodoform packing to the deep open plantar wound Apply Betadine to the entire left foot lateral and plantar incision and the necrotic toe and any open wounds and stitches Then apply Betadine soaked Adaptic to those areas Then apply Betadine soaked gauze to those areas Then apply dry gauze to those areas Then protect the ankle  with dry gauze so there is no strangulation Then apply ABD to the plantar foot Then gently applied a Kerlix Then gently apply the Ace Then gently apply the Coban This is to be changed every single day  Patient will follow-up with Dr. Lowry March 4  . NON FORMULARY Dressing care:  Wash with any wound cleanser Dry the wound Dress left foot latera wound with betadine, betadine soaked adeptic, betadine soaked guaze, cut abd to shape, and large meplex Dress the left medial wound with iodoform packing, betadine soaked adeptic, betadine soaked gauze, cute abd to shape, and large meplex Make sure alll staples are covered with betadine To be changed daily Do not get it wet in the shower.  Antibiotic doxycycle precribed  Referral for Infectious Disease  I have to see the patient next week for wound check  Pathology report still shows osteomyelitis  . NON FORMULARY Dispense one short cam boot for the left foot due to recent toe amputations and non-healing diabetic wound  . pen needle, diabetic 32 gauge x 5/32 (4 mm) Ndle Use with insulin  up to 4 times/day as needed.  . povidone-iodine (BETADINE) 10 % external solution Daily (standard)  . Saccharomyces boulardii (FLORASTOR) 250 mg, 2 times a day (standard)  . senna (SENOKOT) 8.6 mg tablet 1 tablet, Daily (standard)  .  spironolactone (ALDACTONE) 25 mg, Daily (standard)  . TRADJENTA 5 mg, Daily (standard)  . traMADol  (ULTRAM ) 50 mg, 2 times a day     PHYSICAL EXAM    Vitals:   07/31/23 1036  BP: 141/53  Pulse: 61  Temp: 36.4 C (97.6 F)   General: well appearing, no acute distress  Head: normocephalic, atraumatic Neck: Supple, left scar present Cardiac: RRR  Lungs: Easy work of breathing, no adventitial sounds. Abdomen: Soft, non-distended, non tender, without mass Skin: Warm and dry.   Neuro: AAO x 3, appropriate to questions Extremities: Upper extremities warm and perfused. Bilateral lower extremities with edema  Pulses:  Bilateral radial pulses palpable.     DIAGNOSTICS    Images and reports reviewed independently by Dr. Debarah   Labs: Lab Results  Component Value Date   CREATININE 1.50 (H) 02/21/2023   A1C 8.8 (H) 02/05/2023

## 2023-11-12 NOTE — Progress Notes (Signed)
 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 The ulcers on the left foot fourth ray, styloid process, Achilles tendon, lateral leg by the fibula, anterior ankle, and midfoot are all improving well. The wounds are responding positively to the current treatment regimen, which includes mechanical debridement and topical applications. The ulcers are being managed with Betadine to the borders and collagen to the centers, along with compression therapy. Offloading techniques are being employed to aid in healing, particularly for the Achilles tendon ulceration. The potential for participation in a research study involving grafting is being explored, as the wounds appear graft-ready. - Order standing x-rays of the left foot, ankle, and leg to assess bone alignment and further evaluate the ulcers. - Continue daily dressing changes with Betadine to the border and collagen to the center of all wounds, with one layer compression from the toes to the calf. - Ensure offloading of the left Achilles tendon by avoiding contact with blankets, pillows, and the bed, and by elevating the leg with support behind the knee. - Consult with Dr. Conception to determine eligibility for a research study involving grafting for the Achilles tendon ulceration. - Schedule follow-up appointment in one  month.  Discussion: Patient was examined and evaluated. All findings discussed. Risk complication benefits alternative reviewed no promises or guarantees made. All questions entertained and answered. Surgical and non surgical intervention education given-questions answered.Patient was informed if they develop any signs of infection such as worsening wound changes, increased redness, swelling, pain, loss of appetite, fever, nausea, vomiting, chills, shortness of breath, or chest pain, patient to present to ER immediately. Patient expressed understanding. Results Procedure: Mechanical debridement   Description: Mechanical debridement with gauze of wounds on the left foot, ankle, and leg. Povidone-iodine applied to the border and collagen to the center of all wounds. One-layer compression from the toes to the knee.  Physical Exam GENERAL: Patient is awake, alert, and oriented to person, time, and place. Patient is in no acute distress.  VASCULAR: DP and PT pulses are palpable 2 out of 4 BL. Capillary fill time is brisk to digits 1 through 5 BL. Skin temperature of the lower extremities is warm to warm, proximal tibial tuberosity to distal digits BL. No increase in warmth to bilateral LE.  NEUROLOGIC: Light touch sensation intact BL. Epicritic Sensation intact BL. Protective Sensation ABSENT BL.  DERMATOLOGIC: Mild xerosis to legs and feet plantarly. Atrophic shiny mottled skin appearance BL from digits to tibial tuberosity.  Multiple WOUNDS: Left foot fourth ray ulcer getting more epithelial,  Left foot styloid process ulcer filling in more granulation tissue less black Left Achilles tendon ulcer 50% fibrotic 50% granular no tendon deteroitiation yet Left lateral leg by fibula ulcer small improving Left anterior ankle/midfoot ulceration improving  MUSCULOSKELETAL: Muscle strength 4 out of 5 for all dorsiflexors, plantarflexors, inverters, and everters BL.  History of Present Illness Hannah [name] is a 63 year old  female who presents with multiple ulcerations on the left foot, ankle, and leg.  She has ulcerations on the left foot, ankle, and leg, specifically on the left foot fourth ray, left foot styloid process, left Achilles tendon, left lateral leg by the fibula, and left anterior ankle and midfoot.  She is currently applying Betadine to the border of all wounds and collagen to the center, with one layer of compression from the toes to the calf. She changes the dressings daily and offloads the left Achilles by avoiding contact with blankets, pillows, or the bed.  Patient denies any other pedal complaints and denies constitutional sx   DIAGNOSIS:  Diagnosis ICD-10-CM Associated Orders  1. Ankle wound, left, sequela  S91.002S XR Foot 3 Or More Views Left    XR Ankle 3 Or More Views Left    XR Tibia Fibula Left    2. Ulcer of left foot, with fat layer exposed    (CMS-HCC)  L97.522 XR Foot 3 Or More Views Left    XR Ankle 3 Or More Views Left    XR Tibia Fibula Left    3. Lower limb ulcer, ankle, left, with fat layer exposed (CMS-HCC)  L97.322 XR Foot 3 Or More Views Left    XR Ankle 3 Or More Views Left    XR Tibia Fibula Left    4. Pressure injury of left ankle, stage 1  L89.521 XR Foot 3 Or More Views Left    XR Ankle 3 Or More Views Left    XR Tibia Fibula Left     Allergies: Allergies[1] Medications:  Current Medications[2] Medical History: Past Medical History[3] Surgical History: Past Surgical History[4] Social History: Social History[5] Family History: Family History[6] Review of Systems: neg outside c/c Musculoskeletal:  Denies back pain or joint pain  Integument:  Denies rash  Neurologic:  Denies headache, focal weakness or sensory changes  BP 125/54 (BP Site: R Arm, BP Position: Sitting)   Pulse 67   Temp 36.7 C (98.1 F) (Temporal)  Deepali Darji DPM FACFAS Fellowship Trained Foot and Ankle Podiatric Surgeon [facility] Vascular Department  I  personally spent 30 minutes face-to-face and non-face-to-face in the care of this patient, which includes all pre, intra, and post visit time on the date of service. This note was generated through Abridge/Dragon-Dictation/Scribe-Utilization, patient was agreeable to use, and there may be some dictation errors due to the automated service used but reviewed to the best of our ability.   ______________________________________________________________________  Documentation assistance was provided by Tedd Sarks, Scribe, on 11/12/2023 at 10:36 AM for Dr. Lowry Lovings, DPM   ---------------------------------------------------------------------------------------------------------------------- November 12, 2023 9:52 AM. Documentation assistance provided by the Scribe. I was present during the time the encounter was recorded. The information recorded by the Scribe was done at my direction and has been reviewed and validated by me. ----------------------------------------------------------------------------------------------------------------------       [1] Allergies Allergen Reactions  . Cefepime Confusion  . Naproxen Nausea And Vomiting, Nausea Only and Other (See Comments)    Emotional Reaction  Cramps  [2] Current Outpatient Medications  Medication Sig Dispense Refill  . acetaminophen  (TYLENOL ) 500 MG tablet Take 2 tablets (1,000 mg total) by mouth every eight (8) hours.    . amlodipine  (NORVASC ) 2.5 MG tablet Take 1 tablet (2.5 mg total) by mouth daily.    SABRA ascorbic acid, vitamin C, (ASCORBIC ACID) 500 MG tablet Take 1 tablet (500 mg total) by mouth daily.    . aspirin  81 MG chewable tablet  Chew 1 tablet (81 mg total) daily.    . atorvastatin  (LIPITOR) 80 MG tablet Take 1 tablet (80 mg total) by mouth daily.    . bisoprolol  (ZEBETA ) 5 MG tablet Take 0.5 tablets (2.5 mg total) by mouth daily.    . blood sugar diagnostic (GLUCOSE BLOOD) Strp Use to check blood sugar as directed with  insulin  3 times a day & for symptoms of high or low blood sugar.    . blood-glucose meter kit Use as instructed    . clonazePAM  (KLONOPIN ) 0.5 MG tablet Take 1 tablet (0.5 mg total) by mouth nightly as needed.    . clopidogrel  (PLAVIX ) 75 mg tablet Take 1 tablet (75 mg total) by mouth daily.    . cyclobenzaprine  (FLEXERIL ) 10 MG tablet Take 1 tablet (10 mg total) by mouth two (2) times a day as needed.    . dextrose  40 % gel Take 37.5 g (15 g of dextrose  total) by mouth as needed.    . famotidine  (PEPCID ) 20 MG tablet Take 1 tablet (20 mg total) by mouth two (2) times a day.    . ferrous sulfate  325 (65 FE) MG tablet Take 1 tablet (325 mg total) by mouth in the morning.    . furosemide (LASIX) 40 MG tablet Take 1 tablet (40 mg total) by mouth daily.    . gabapentin  (NEURONTIN ) 300 MG capsule Take 1 capsule (300 mg total) by mouth Three (3) times a day.    SABRA glucagon solution 1 mg SolR Inject as directed as needed (IM prn for BG less than 70).    SABRA HYDROcodone-acetaminophen  (NORCO) 5-325 mg per tablet Take 1 tablet by mouth every six (6) hours as needed for pain.    . insulin  glargine (BASAGLAR , LANTUS ) 100 unit/mL (3 mL) injection pen Inject 0.24 mL (24 Units total) under the skin nightly.    . insulin  lispro (HUMALOG) 100 unit/mL injection Inject under the skin Three (3) times a day before meals.    SABRA lancets Misc Use to check blood sugar as directed with insulin  3 times a day & for symptoms of high or low blood sugar.    . linagliptin (TRADJENTA) 5 mg Tab Take 1 tablet (5 mg total) by mouth daily.    SABRA linagliptin (TRADJENTA) 5 mg Tab Take 1 tablet (5 mg total) by mouth daily.    . meclizine (ANTIVERT) 25 mg tablet Chew 1 tablet (25 mg total) Three (3) times a day as needed.    . metOLazone (ZAROXOLYN) 2.5 MG tablet     . NON FORMULARY Skilled nursing facility dressing change recommendation prescription Left lower extremity should be completely washed every day with wound cleanser Dry the area  with a dry gauze Apply Iodoform packing to the deep open plantar wound Apply Betadine to the entire left foot lateral and plantar incision and the necrotic toe and any open wounds and stitches Then apply Betadine soaked Adaptic to those areas Then apply Betadine soaked gauze to those areas Then apply dry gauze to those areas Then protect the ankle with dry gauze so there is no strangulation Then apply ABD to the plantar foot Then gently applied a Kerlix Then gently apply the Ace Then gently apply the Coban This is to be changed every single day  Patient will follow-up with Dr. Lowry March 4 1 each 0  . NON FORMULARY Dressing care:  Wash with any wound cleanser Dry the wound Dress left foot latera wound with betadine, betadine  soaked adeptic, betadine soaked guaze, cut abd to shape, and large meplex Dress the left medial wound with iodoform packing, betadine soaked adeptic, betadine soaked gauze, cute abd to shape, and large meplex Make sure alll staples are covered with betadine To be changed daily Do not get it wet in the shower.  Antibiotic doxycycle precribed  Referral for Infectious Disease  I have to see the patient next week for wound check  Pathology report still shows osteomyelitis 1 each 0  . NON FORMULARY Dispense one short cam boot for the left foot due to recent toe amputations and non-healing diabetic wound 1 each 0  . pen needle, diabetic 32 gauge x 5/32 (4 mm) Ndle Use with insulin  up to 4 times/day as needed.    . povidone-iodine (BETADINE) 10 % external solution Apply topically daily.    . Saccharomyces boulardii (FLORASTOR) 250 mg capsule Take 1 capsule (250 mg total) by mouth two (2) times a day. 1 tablet bid For 30 days starting on 05/17/23    . senna (SENOKOT) 8.6 mg tablet Take 1 tablet by mouth daily.    SABRA spironolactone (ALDACTONE) 25 MG tablet Take 1 tablet (25 mg total) by mouth daily.    . traMADol  (ULTRAM ) 50 mg tablet Take 1 tablet (50 mg total) by  mouth two (2) times a day.     No current facility-administered medications for this visit.  [3] Past Medical History: Diagnosis Date  . CHF (congestive heart failure) (CMS-HCC)   . CKD (chronic kidney disease)   . Diabetes mellitus (CMS-HCC)   . Hypertension   . TIA (transient ischemic attack)   [4] Past Surgical History: Procedure Laterality Date  . PR AMPUTATION METATARSAL+TOE,SINGLE Left 01/28/2023   Procedure: AMPUTATION, METATARSAL, WITH TOE SINGLE;  Surgeon: Lowry Lovings, DPM;  Location: OR [facility];  Service: Vascular  . PR AMPUTATION METATARSAL+TOE,SINGLE Left 04/19/2023   Procedure: AMPUTATION, METATARSAL, WITH TOE SINGLE;  Surgeon: Lowry Lovings, DPM;  Location: [facility];  Service: Vascular  . PR DEBRIDEMENT BONE 1ST 20 SQ CM/< Left 01/27/2023   Procedure: DEBRIDEMENT, BONE (INCLUDES EPIDERMIS, DERMIS, SUBCUTANEOUS TISSUE, MUSCLE AND/OR FASCIA, IF PERFORMED); FIRST 20 SQ CM OR LESS; FOOT;  Surgeon: Luwanna Caretha Anis, MD;  Location[facility];  Service: Vascular  . PR DEEP INCIS FOOT BONE INFECTN Left 01/28/2023   Procedure: INCISION, BONE CORTEX, FOOT;  Surgeon: Lowry Lovings, DPM;  Location: OR [facility];  Service: Vascular  . PR SECONDARY CLOSURE SURG WOUND/DEHSN XTNSV/COMP Left 02/06/2023   Procedure: SECONDARY CLOSURE OF SURGICAL WOUND OR DEHISCENCE, EXTENSIVE OR COMPLICATED;  Surgeon: Lowry Lovings, DPM;  Location: OR [facility];  Service: Vascular  . PR SECONDARY CLOSURE SURG WOUND/DEHSN XTNSV/COMP Left 04/19/2023   Procedure: SECONDARY CLOSURE OF SURGICAL WOUND OR DEHISCENCE, EXTENSIVE OR COMPLICATED;  Surgeon: Lowry Lovings, DPM;  Location: [facility];  Service: Vascular  [5] Social History Socioeconomic History  . Marital status: Married    Spouse name: None  . Number of children: None  . Years of education: None  . Highest education level: None  Tobacco Use  . Smoking status: Every Day    Current packs/day: 0.50    Average packs/day: 1.5  packs/day for 20.8 years (30.7 ttl pk-yrs)    Types: Cigarettes    Start date: 2005  . Smokeless tobacco: Never  . Tobacco comments:    Smoking 10 cigarettes a day  Substance and Sexual Activity  . Alcohol use: Not Currently  . Drug use: Not Currently  Social Drivers of Corporate Investment Banker Strain: Low Risk  (02/12/2023)   Overall Financial Resource Strain (CARDIA)   . Difficulty of Paying Living Expenses: Not very hard  Food Insecurity: Food Insecurity Present (02/12/2023)   Hunger Vital Sign   . Worried About Programme Researcher, Broadcasting/film/video in the Last Year: Sometimes true   . Ran Out of Food in the Last Year: Sometimes true  Transportation Needs: No Transportation Needs (02/12/2023)   PRAPARE - Transportation   . Lack of Transportation (Medical): No   . Lack of Transportation (Non-Medical): No  Housing: Low Risk  (02/12/2023)   Housing   . Within the past 12 months, have you ever stayed: outside, in a car, in a tent, in an overnight shelter, or temporarily in someone else's home (i.e. couch-surfing)?: No   . Are you worried about losing your housing?: No  [6] No family history on file.

## 2023-12-23 DIAGNOSIS — I10 Essential (primary) hypertension: Secondary | ICD-10-CM | POA: Diagnosis not present

## 2023-12-23 DIAGNOSIS — I249 Acute ischemic heart disease, unspecified: Secondary | ICD-10-CM | POA: Diagnosis not present

## 2023-12-23 DIAGNOSIS — I509 Heart failure, unspecified: Secondary | ICD-10-CM | POA: Diagnosis not present

## 2023-12-23 DIAGNOSIS — I2510 Atherosclerotic heart disease of native coronary artery without angina pectoris: Secondary | ICD-10-CM | POA: Diagnosis not present

## 2023-12-23 DIAGNOSIS — I214 Non-ST elevation (NSTEMI) myocardial infarction: Secondary | ICD-10-CM | POA: Diagnosis not present

## 2023-12-23 DIAGNOSIS — R112 Nausea with vomiting, unspecified: Secondary | ICD-10-CM | POA: Diagnosis not present

## 2023-12-23 DIAGNOSIS — R9431 Abnormal electrocardiogram [ECG] [EKG]: Secondary | ICD-10-CM | POA: Diagnosis not present

## 2023-12-23 DIAGNOSIS — E1165 Type 2 diabetes mellitus with hyperglycemia: Secondary | ICD-10-CM | POA: Diagnosis not present

## 2023-12-23 DIAGNOSIS — E785 Hyperlipidemia, unspecified: Secondary | ICD-10-CM | POA: Diagnosis not present

## 2023-12-23 DIAGNOSIS — Z8673 Personal history of transient ischemic attack (TIA), and cerebral infarction without residual deficits: Secondary | ICD-10-CM | POA: Diagnosis not present

## 2023-12-23 DIAGNOSIS — I517 Cardiomegaly: Secondary | ICD-10-CM | POA: Diagnosis not present

## 2023-12-23 DIAGNOSIS — N189 Chronic kidney disease, unspecified: Secondary | ICD-10-CM | POA: Diagnosis not present

## 2023-12-24 ENCOUNTER — Encounter (HOSPITAL_COMMUNITY): Payer: Self-pay

## 2023-12-24 ENCOUNTER — Encounter (HOSPITAL_COMMUNITY): Payer: Self-pay | Admitting: Cardiology

## 2023-12-24 ENCOUNTER — Inpatient Hospital Stay (HOSPITAL_COMMUNITY)
Admission: EM | Admit: 2023-12-24 | Discharge: 2023-12-26 | DRG: 281 | Disposition: A | Discharge status: Skilled Nursing Facility | Attending: Internal Medicine | Admitting: Internal Medicine

## 2023-12-24 DIAGNOSIS — Z9049 Acquired absence of other specified parts of digestive tract: Secondary | ICD-10-CM | POA: Diagnosis not present

## 2023-12-24 DIAGNOSIS — E782 Mixed hyperlipidemia: Secondary | ICD-10-CM | POA: Diagnosis present

## 2023-12-24 DIAGNOSIS — I214 Non-ST elevation (NSTEMI) myocardial infarction: Principal | ICD-10-CM | POA: Diagnosis present

## 2023-12-24 DIAGNOSIS — I130 Hypertensive heart and chronic kidney disease with heart failure and stage 1 through stage 4 chronic kidney disease, or unspecified chronic kidney disease: Secondary | ICD-10-CM | POA: Diagnosis present

## 2023-12-24 DIAGNOSIS — E1122 Type 2 diabetes mellitus with diabetic chronic kidney disease: Secondary | ICD-10-CM | POA: Diagnosis present

## 2023-12-24 DIAGNOSIS — I25119 Atherosclerotic heart disease of native coronary artery with unspecified angina pectoris: Secondary | ICD-10-CM

## 2023-12-24 DIAGNOSIS — I2489 Other forms of acute ischemic heart disease: Secondary | ICD-10-CM

## 2023-12-24 DIAGNOSIS — I5032 Chronic diastolic (congestive) heart failure: Secondary | ICD-10-CM | POA: Diagnosis present

## 2023-12-24 DIAGNOSIS — Z888 Allergy status to other drugs, medicaments and biological substances status: Secondary | ICD-10-CM | POA: Diagnosis not present

## 2023-12-24 DIAGNOSIS — E1169 Type 2 diabetes mellitus with other specified complication: Secondary | ICD-10-CM | POA: Diagnosis present

## 2023-12-24 DIAGNOSIS — Z8673 Personal history of transient ischemic attack (TIA), and cerebral infarction without residual deficits: Secondary | ICD-10-CM | POA: Diagnosis not present

## 2023-12-24 DIAGNOSIS — Z833 Family history of diabetes mellitus: Secondary | ICD-10-CM | POA: Diagnosis not present

## 2023-12-24 DIAGNOSIS — I509 Heart failure, unspecified: Secondary | ICD-10-CM | POA: Diagnosis not present

## 2023-12-24 DIAGNOSIS — Z794 Long term (current) use of insulin: Secondary | ICD-10-CM | POA: Diagnosis not present

## 2023-12-24 DIAGNOSIS — Z86718 Personal history of other venous thrombosis and embolism: Secondary | ICD-10-CM | POA: Diagnosis not present

## 2023-12-24 DIAGNOSIS — K224 Dyskinesia of esophagus: Secondary | ICD-10-CM | POA: Diagnosis present

## 2023-12-24 DIAGNOSIS — Z7984 Long term (current) use of oral hypoglycemic drugs: Secondary | ICD-10-CM | POA: Diagnosis not present

## 2023-12-24 DIAGNOSIS — R079 Chest pain, unspecified: Secondary | ICD-10-CM | POA: Diagnosis present

## 2023-12-24 DIAGNOSIS — E11649 Type 2 diabetes mellitus with hypoglycemia without coma: Secondary | ICD-10-CM | POA: Diagnosis not present

## 2023-12-24 DIAGNOSIS — F17210 Nicotine dependence, cigarettes, uncomplicated: Secondary | ICD-10-CM | POA: Diagnosis present

## 2023-12-24 DIAGNOSIS — I959 Hypotension, unspecified: Secondary | ICD-10-CM | POA: Diagnosis not present

## 2023-12-24 DIAGNOSIS — Z7902 Long term (current) use of antithrombotics/antiplatelets: Secondary | ICD-10-CM | POA: Diagnosis not present

## 2023-12-24 DIAGNOSIS — N184 Chronic kidney disease, stage 4 (severe): Secondary | ICD-10-CM | POA: Diagnosis present

## 2023-12-24 DIAGNOSIS — Z8249 Family history of ischemic heart disease and other diseases of the circulatory system: Secondary | ICD-10-CM | POA: Diagnosis not present

## 2023-12-24 DIAGNOSIS — E1151 Type 2 diabetes mellitus with diabetic peripheral angiopathy without gangrene: Secondary | ICD-10-CM | POA: Diagnosis present

## 2023-12-24 DIAGNOSIS — Z79899 Other long term (current) drug therapy: Secondary | ICD-10-CM | POA: Diagnosis not present

## 2023-12-24 DIAGNOSIS — I2510 Atherosclerotic heart disease of native coronary artery without angina pectoris: Secondary | ICD-10-CM | POA: Diagnosis present

## 2023-12-24 DIAGNOSIS — Z90710 Acquired absence of both cervix and uterus: Secondary | ICD-10-CM | POA: Diagnosis not present

## 2023-12-24 DIAGNOSIS — I249 Acute ischemic heart disease, unspecified: Secondary | ICD-10-CM | POA: Diagnosis not present

## 2023-12-24 DIAGNOSIS — I10 Essential (primary) hypertension: Secondary | ICD-10-CM | POA: Diagnosis present

## 2023-12-24 HISTORY — DX: Type 2 diabetes mellitus without complications: E11.9

## 2023-12-24 HISTORY — DX: Osteomyelitis, unspecified: M86.9

## 2023-12-24 HISTORY — DX: Anxiety disorder, unspecified: F41.9

## 2023-12-24 HISTORY — DX: Essential (primary) hypertension: I10

## 2023-12-24 HISTORY — DX: Transient cerebral ischemic attack, unspecified: G45.9

## 2023-12-24 HISTORY — DX: Disorder of arteries and arterioles, unspecified: I77.9

## 2023-12-24 HISTORY — DX: Peripheral vascular disease, unspecified: I73.9

## 2023-12-24 HISTORY — DX: Atherosclerotic heart disease of native coronary artery without angina pectoris: I25.10

## 2023-12-24 HISTORY — DX: Non-ST elevation (NSTEMI) myocardial infarction: I21.4

## 2023-12-24 LAB — BASIC METABOLIC PANEL WITH GFR
Anion gap: 6 (ref 5–15)
BUN: 18 mg/dL (ref 8–23)
CO2: 27 mmol/L (ref 22–32)
Calcium: 8.4 mg/dL — ABNORMAL LOW (ref 8.9–10.3)
Chloride: 108 mmol/L (ref 98–111)
Creatinine, Ser: 1.22 mg/dL — ABNORMAL HIGH (ref 0.44–1.00)
GFR, Estimated: 49 mL/min — ABNORMAL LOW (ref >60)
Glucose, Bld: 107 mg/dL — ABNORMAL HIGH (ref 70–99)
Potassium: 3.6 mmol/L (ref 3.5–5.1)
Sodium: 141 mmol/L (ref 135–145)

## 2023-12-24 LAB — CBC
HCT: 30 % — ABNORMAL LOW (ref 36.0–46.0)
Hemoglobin: 9.7 g/dL — ABNORMAL LOW (ref 12.0–15.0)
MCH: 30.1 pg (ref 26.0–34.0)
MCHC: 32.3 g/dL (ref 30.0–36.0)
MCV: 93.2 fL (ref 80.0–100.0)
Platelets: 205 K/uL (ref 150–400)
RBC: 3.22 MIL/uL — ABNORMAL LOW (ref 3.87–5.11)
RDW: 14.5 % (ref 11.5–15.5)
WBC: 9.9 K/uL (ref 4.0–10.5)
nRBC: 0 % (ref 0.0–0.2)

## 2023-12-24 LAB — HEPARIN LEVEL (UNFRACTIONATED): Heparin Unfractionated: 0.4 [IU]/mL (ref 0.30–0.70)

## 2023-12-24 LAB — GLUCOSE, CAPILLARY: Glucose-Capillary: 109 mg/dL — ABNORMAL HIGH (ref 70–99)

## 2023-12-24 LAB — TROPONIN I (HIGH SENSITIVITY): Troponin I (High Sensitivity): 624 ng/L (ref <18)

## 2023-12-24 LAB — HEMOGLOBIN A1C
Hgb A1c MFr Bld: 8.9 % — ABNORMAL HIGH (ref 4.8–5.6)
Mean Plasma Glucose: 208.73 mg/dL

## 2023-12-24 MED ORDER — AMLODIPINE BESYLATE 5 MG PO TABS
5.0000 mg | ORAL_TABLET | Freq: Every day | ORAL | Status: DC
  Administered 2023-12-24 – 2023-12-26 (×3): 5 mg via ORAL
  Filled 2023-12-24 (×3): qty 1

## 2023-12-24 MED ORDER — HEPARIN (PORCINE) 25000 UT/250ML-% IV SOLN
1250.0000 [IU]/h | INTRAVENOUS | Status: DC
  Administered 2023-12-24: 1150 [IU]/h via INTRAVENOUS
  Administered 2023-12-25: 1250 [IU]/h via INTRAVENOUS
  Filled 2023-12-24 (×3): qty 250

## 2023-12-24 MED ORDER — ATORVASTATIN CALCIUM 80 MG PO TABS
80.0000 mg | ORAL_TABLET | Freq: Every day | ORAL | Status: DC
  Administered 2023-12-25 – 2023-12-26 (×2): 80 mg via ORAL
  Filled 2023-12-24 (×2): qty 1

## 2023-12-24 MED ORDER — CLOPIDOGREL BISULFATE 75 MG PO TABS
75.0000 mg | ORAL_TABLET | Freq: Every day | ORAL | Status: DC
  Administered 2023-12-25 – 2023-12-26 (×2): 75 mg via ORAL
  Filled 2023-12-24 (×2): qty 1

## 2023-12-24 MED ORDER — SERTRALINE HCL 50 MG PO TABS
25.0000 mg | ORAL_TABLET | Freq: Every day | ORAL | Status: DC
  Administered 2023-12-25 – 2023-12-26 (×2): 25 mg via ORAL
  Filled 2023-12-24 (×2): qty 1

## 2023-12-24 MED ORDER — CLONAZEPAM 0.5 MG PO TABS
0.5000 mg | ORAL_TABLET | Freq: Every day | ORAL | Status: DC
  Administered 2023-12-24 – 2023-12-25 (×2): 0.5 mg via ORAL
  Filled 2023-12-24 (×2): qty 1

## 2023-12-24 MED ORDER — INSULIN ASPART 100 UNIT/ML IJ SOLN: 0.0000 [IU] | Freq: Three times a day (TID) | INTRAMUSCULAR | Status: DC

## 2023-12-24 MED ORDER — NITROGLYCERIN 0.4 MG SL SUBL: 0.4000 mg | SUBLINGUAL_TABLET | SUBLINGUAL | Status: DC | PRN

## 2023-12-24 MED ORDER — INSULIN GLARGINE 100 UNIT/ML ~~LOC~~ SOLN
12.0000 [IU] | Freq: Every day | SUBCUTANEOUS | Status: DC
  Administered 2023-12-24: 12 [IU] via SUBCUTANEOUS
  Filled 2023-12-24 (×2): qty 0.12

## 2023-12-24 MED ORDER — CYCLOBENZAPRINE HCL 10 MG PO TABS: 10.0000 mg | ORAL_TABLET | Freq: Two times a day (BID) | ORAL | Status: DC | PRN

## 2023-12-24 MED ORDER — ACETAMINOPHEN 325 MG PO TABS: 650.0000 mg | ORAL_TABLET | ORAL | Status: DC | PRN

## 2023-12-24 MED ORDER — TRAMADOL HCL 50 MG PO TABS: 50.0000 mg | ORAL_TABLET | Freq: Two times a day (BID) | ORAL | Status: DC | PRN

## 2023-12-24 MED ORDER — ONDANSETRON HCL 4 MG/2ML IJ SOLN: 4.0000 mg | Freq: Four times a day (QID) | INTRAMUSCULAR | Status: DC | PRN

## 2023-12-24 MED ORDER — ASPIRIN 81 MG PO TBEC
81.0000 mg | DELAYED_RELEASE_TABLET | Freq: Every day | ORAL | Status: DC
  Administered 2023-12-25 – 2023-12-26 (×2): 81 mg via ORAL
  Filled 2023-12-24 (×2): qty 1

## 2023-12-24 MED ORDER — GABAPENTIN 300 MG PO CAPS
300.0000 mg | ORAL_CAPSULE | Freq: Three times a day (TID) | ORAL | Status: DC
  Administered 2023-12-24 – 2023-12-26 (×5): 300 mg via ORAL
  Filled 2023-12-24 (×5): qty 1

## 2023-12-24 MED ORDER — BISOPROLOL FUMARATE 5 MG PO TABS
2.5000 mg | ORAL_TABLET | Freq: Every day | ORAL | Status: DC
  Administered 2023-12-25 – 2023-12-26 (×2): 2.5 mg via ORAL
  Filled 2023-12-24 (×2): qty 1

## 2023-12-24 MED ORDER — INSULIN GLARGINE-YFGN 100 UNIT/ML ~~LOC~~ SOLN: 12.0000 [IU] | Freq: Every day | SUBCUTANEOUS | Status: DC

## 2023-12-24 NOTE — H&P (Signed)
 CARDIOLOGY ADMISSION HISTORY & PHYSICAL  Patient ID: Hannah [name]; [id number]; [birth year]   Admission date: 12/24/2023  Primary Care Provider: [name], MD Primary Cardiologist:  Dr. Alm Cunas ([place], [place])  Chief Complaint: Abdominal pain, nausea and emesis, chest discomfort  HISTORY OF PRESENT ILLNESS  Hannah [name] is a medically complex 63 y.o. female with past medical history outlined below now transferred from [facility] reportedly for cardiac catheterization following cardiology consultation with Dr. Vevelyn on December 8. I am seeing her on call tonight and am not certain to what degree discussion was had with our practice here in [place] regarding plan.  I reviewed the available records which are divided amongst healthcare facilities in [place], [facility] [place], and more recently [place].  Hannah [name] tells me that she has been in a nursing rehabilitation facility for months, largely unable to ambulate without assistance.  She has a complex history of PAD and underwent partial 4th/5th ray amputations on the left at [facility] back in April. Per review of Vascular Surgery notes in Care Everywhere, peripheral angiogram has been attempted twice in the last year. First time was cancelled due to a groin infection. Second time was reportedly cancelled by Cardiology due CHF and CKD. There is mention of occlusive disease of proximal SFA on IR angiogram in 03/2023 but it was felt to be too high risk to proceed with intervention. ABI 0.55 on the left and 1.06 on the right in 07/2023.  She was sent from her facility to [facility] reportedly due to abdominal pain associated with nausea and emesis as well as diarrhea, inability to take her medications, and ultimately intermittent chest discomfort.  She tells me on interview this evening that she does not recall the specifics of her original presentation.  She was found to have mild elevation in high-sensitivity  troponin I levels to a peak of 2.13.  Available ECGs from [facility] show sinus rhythm with nonspecific ST changes.  Pro-BNP was elevated at 2480, she did undergo an echocardiogram which reported LVEF of 55 to 60% with mild to moderate LVH, no specific regional wall motion abnormalities, trivial mitral regurgitation, and mild aortic valve sclerosis without stenosis.  Cardiology consultation by Dr. Vevelyn indicated concern for NSTEMI and general plan for cardiac catheterization.  Per chart review it is noted that the patient has a known history of multivessel CAD documented by cardiac catheterization in [place] back in 2022 with coronary anatomy as follows:   Prox RCA lesion is 75% stenosed.   Prox LAD to Mid LAD lesion is 75% stenosed.   Mid LAD lesion is 90% stenosed.   Prox Cx lesion is 75% stenosed.   Mid Cx lesion is 90% stenosed.   RPDA lesion is 90% stenosed.   RPAV lesion is 75% stenosed.   1st Mrg lesion is 90% stenosed.   1st Diag lesion is 75% stenosed.   She was seen by cardiothoracic surgery and felt to be a poor candidate for CABG with plan for medical management.  She does not report any active symptoms at this time.  She was transferred on IV heparin  which continues.  Difficult to be certain about her most recent active medication list based on provided information.  ROS  Pertinent review in the history of present illness.  No palpitations or syncope.  Past Medical History:  Diagnosis Date   Anxiety and depression    CAD (coronary artery disease)    Multivessel diagnosed at cardiac catheterization in  2022 - felt to be poor candidate for CABG and managed medically   Carotid artery disease    Status post TCAR 2023   Chronic diastolic (congestive) heart failure (HCC)    CKD (chronic kidney disease), stage III (HCC)    DVT (deep venous thrombosis) (HCC)    Iron deficiency anemia    Osteomyelitis (HCC)    PAD (peripheral artery disease)    Osteomyelitis  status post 4th/5th ray amputation on the left April 2025 [facility]   Primary hypertension    TIA (transient ischemic attack)    Type 2 diabetes mellitus (HCC)     Past Surgical History:  Procedure Laterality Date   ABDOMINAL HYSTERECTOMY     BACK SURGERY     CHOLECYSTECTOMY     TOE AMPUTATION     TRANSCAROTID ARTERY REVASCULARIZATION (TCAR)     VULVA /PERINEUM BIOPSY N/A 08/29/2016   Procedure: Irrigation and Debridement with Insertion of Interuterine Device;  Surgeon: [name], MD;  Location: [facility] OR;  Service: Gynecology;  Laterality: N/A;  Irrigation and Debridement with Insertion of Interuterine Device     SOCIAL HISTORY Social History   Tobacco Use   Smoking status: Every Day    Current packs/day: 1.50    Average packs/day: 1.5 packs/day for 15.0 years (22.5 ttl pk-yrs)    Types: Cigarettes   Smokeless tobacco: Never  Substance Use Topics   Alcohol use: No    FAMILY HISTORY The patient's family history includes Diabetes Mellitus II in her father; Hypertension in her mother; Kidney disease in her father.    MEDICATIONS PRIOR TO ADMISSION This medication list may be incomplete and is based on cardiology consultation note from [facility]: Lipitor 80 mg daily Plavix  75 mg daily Tylenol  650 mg every 4 hours as needed Lasix 40 mg daily Bisoprolol  2.5 mg daily Klonopin  0.5 mg at bedtime Flexeril  10 mg 3 times daily Famotidine  20 mg daily Ferrous sulfate  3 to 25 mg daily Gabapentin  300 mg 3 times a day Hydrocodone 5 mg every 6 hours as needed Lantus  insulin  Humalog Meclizine 25 mg every 8 hours as needed Senna 17.2 mg every 12 hours as needed Zoloft  25 mg daily Tramadol  50 mg twice daily  ALLERGIES Allergies  Allergen Reactions   Naproxen Other (See Comments)    Emotional Reaction Cramps    PHYSICAL EXAM & DATA Vitals:   12/24/23 1821  BP: (!) 162/73  Pulse: 71  Resp: 14  Temp: 99.1 F (37.3 C)  TempSrc: Oral  SpO2: 96%   No intake  or output data in the 24 hours ending 12/24/23 1906 There were no vitals filed for this visit. There is no height or weight on file to calculate BMI.   Gen: Chronically ill-appearing woman in no distress, no active chest pain. HEENT: Conjunctiva and lids normal, oropharynx clear. Neck: Supple, no elevated JVP, bilateral carotid bruits. Lungs: Clear to auscultation, nonlabored breathing at rest. Cardiac: Regular rate and rhythm, no S3, 1/6 systolic murmur, no pericardial rub. Abdomen: Soft, nontender, bowel sounds present. Extremities: Venous insufficiency, left foot dressed. Skin: Warm and dry. Musculoskeletal: No kyphosis. Neuropsychiatric: Alert and oriented x3, affect grossly appropriate.  EKG:  An ECG dated 12/23/2023 was personally reviewed today and demonstrated:  Sinus rhythm with nonspecific ST changes.  Current telemetry shows sinus rhythm.  RELEVANT CV STUDIES  Refer to study summaries in HPI.  LABORATORY DATA  Laboratory data from [facility]: December 9: Hemoglobin 9.6, platelets 207, potassium 3.6,  BUN 24, creatinine 1.1, GFR 56, AST 33, ALT 17, albumin 3.0  Radiology/Studies:   Chest x-ray from [facility] reported no acute cardiopulmonary process.  ASSESSMENT & PLAN  1.  Patient transferred from [facility] after cardiology consultation by Dr. Vevelyn on December 8.  She was seen following apparent presentation with abdominal pain, nausea and emesis, as well as intermittent chest discomfort.  High-sensitivity troponin I level peaked at 2.13, ECG showed nonspecific ST changes.  Echocardiogram indicated normal LVEF at 55 to 60% with no specific regional wall motion abnormalities.  She was treated for suspected ACS, started on heparin , and transferred here for apparent diagnostic cardiac catheterization.  I do not have any details regarding discussion about plan with our practice at hand however.  She is currently symptom-free.  She has a known history of  multivessel CAD per chart review, underwent cardiac catheterization in [place] back in 2022 and felt to be a poor candidate for surgical revascularization at that time with plan for medical therapy.  Additional comorbidities include severe PAD as discussed above, CKD stage IV per recent nephrology consultation (although her renal function has improved recently with IV fluids), type 2 diabetes mellitus, carotid artery disease status post TCAR in 2023, recurrent osteomyelitis most recently status post left 4th and 5th ray amputation at [facility] in April, and currently residing in a nursing rehabilitation facility for months with very limited mobility status.  2.  Primary hypertension.  Current antihypertensive regimen is unclear per chart review other than low-dose bisoprolol  daily, was potentially on Cozaar  however this may have been held with her renal insufficiency.  3.  Type 2 diabetes mellitus on insulin .  4.  Severe PAD as discussed above.  Left foot is dressed, status post left 4th and 5th ray amputation at [facility] in April.  She will need wound care consultation.  5.  CKD stage IV per chart review, seen recently by nephrology.  Current renal function improved with creatinine 1.1 and GFR 56 at [facility].  6.  Mixed hyperlipidemia on Lipitor 80 mg daily.  7.  Carotid artery disease status post right TCAR, on Plavix .  Patient being admitted to cardiology this evening for further management.  Per my review of records and discussion with the patient, I would not recommend pursuing a cardiac catheterization at this time given no active symptoms with mild high-sensitivity troponin I trend (certainly in range to suspect demand ischemia in light of her substrate), no acute ST segment changes by ECG, normal LVEF without wall motion abnormalities by echocardiogram and complex medical history with poor functional status at baseline.  She has known multivessel CAD documented in 2022  and at that time was felt to be a poor surgical candidate with plan for medical therapy.  Reasonable to continue 48 to 72-hour course of IV heparin  and otherwise continue Plavix  75 mg daily along with high-dose statin therapy.  Need to check hemoglobin A1c and solidify diabetes regimen as well as flush out antihypertensive therapy.  Wound care for left foot as well.  Signed, Jayson Sierras, MD  12/24/2023 7:06 PM

## 2023-12-24 NOTE — Progress Notes (Signed)
 PHARMACY - ANTICOAGULATION CONSULT NOTE  Pharmacy Consult for Heparin   Indication: chest pain/ACS  Allergies  Allergen Reactions   Naproxen Other (See Comments)    Emotional Reaction Cramps    Patient Measurements: Height: (P) 5' 8 (172.7 cm) Weight: (P) 99.7 kg (219 lb 12.8 oz) IBW/kg (Calculated) : (P) 63.9 HEPARIN  DW (KG): (P) 85.8  Vital Signs: Temp: 99 F (37.2 C) (12/09 1958) Temp Source: Oral (12/09 1958) BP: 181/73 (12/09 1958) Pulse Rate: 69 (12/09 1958)  Labs: Recent Labs    12/24/23 2109  HGB 9.7*  HCT 30.0*  PLT 205  HEPARINUNFRC 0.40    CrCl cannot be calculated (Patient's most recent lab result is older than the maximum 21 days allowed.).   Medical History: Past Medical History:  Diagnosis Date   Anxiety and depression    CAD (coronary artery disease)    Multivessel diagnosed at cardiac catheterization in 2022 - felt to be poor candidate for CABG and managed medically   Carotid artery disease    Status post TCAR 2023   Chronic diastolic (congestive) heart failure (HCC)    CKD (chronic kidney disease), stage III (HCC)    DVT (deep venous thrombosis) (HCC)    Iron deficiency anemia    Osteomyelitis (HCC)    PAD (peripheral artery disease)    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 on the left April 2025 - [facility]   Primary hypertension    TIA (transient ischemic attack)    Type 2 diabetes mellitus (HCC)     Medications:   Assessment: Hannah [name] with prior cardiac history transferred from [facility] on heparin  infusion for medical management of ACS.   Heparin  infusion running at 1150 units/hr on transfer from [facility] and it seems it has been running at this rate since this morning. Ordering heparin  level now to adjust infusion per our protocol.  HL therapeutic at 0.40.   Goal of Therapy:  Heparin  level 0.3-0.7 units/ml Monitor platelets by anticoagulation protocol: Yes   Plan:  Continue heparin  infusion at  1150 units/hr Check confirmatory anti-Xa in 6 hours  Continue to monitor H&H and platelets  Rankin Sams 12/24/2023,10:24 PM

## 2023-12-24 NOTE — Progress Notes (Signed)
   Patient transferred from [facility] Please see Dr. Madalyn H&P for more information. Helped enter admission orders. Per discussion with Dr. Debera.   Unclear exactly what patient was on prior to admission but Cardiology consult note from [facility] mentions the following: Lipitor 80 mg daily Plavix  75 mg daily Tylenol  650 mg every 4 hours as needed Lasix 40 mg daily Bisoprolol  2.5 mg daily Klonopin  0.5 mg at bedtime Flexeril  10 mg 3 times daily Famotidine  20 mg daily Ferrous sulfate  3 to 25 mg daily Gabapentin  300 mg 3 times a day Hydrocodone 5 mg every 6 hours as needed Lantus  insulin  24 units qhs Humalog Meclizine 25 mg every 8 hours as needed Senna 17.2 mg every 12 hours as needed Zoloft  25 mg daily Tramadol  50 mg twice daily  - Will continue IV Heparin . - Will continue home Plavix  75mg  daily and start Aspirin  81mg  daily as well. - Will continue Lipitor 80mg  daily.  - Will continue Bisoprolol  2.5mg  daily. - Will start Amlodipine  5mg  daily. - Will start sliding scale insulin  and go ahead and consult diabetes coordinator. - Will hold Lasix for now given renal function. - It looks like she was previously on Losartan  and Spironolactone but unclear if she was on these directly prior to current admission. - I was able to confirm that she has recently had above the above doses of Klonopin , Gabapentin , Flexeril , Tramadol , and Zoloft  under the reconcile external medication tab and these were last dispensed in 11/2023. Therefore, will reorder these.  Hydrocodone has not been dispensed since 08/2023 so will hold off on reordering this for now.  Will need to complete full medication reconciliation. Will ask pharmacy team to assist with this.  Discussed code status with patient and she would like to be full code.  Hannah Righetti E Morgen Ritacco, PA-C 12/24/2023 8:39 PM

## 2023-12-24 NOTE — Progress Notes (Signed)
 PHARMACY - ANTICOAGULATION CONSULT NOTE  Pharmacy Consult for Heparin   Indication: chest pain/ACS  Allergies  Allergen Reactions   Naproxen Other (See Comments)    Emotional Reaction Cramps    Patient Measurements: Height: (P) 5' 8 (172.7 cm) Weight: (P) 99.7 kg (219 lb 12.8 oz) IBW/kg (Calculated) : (P) 63.9 HEPARIN  DW (KG): (P) 85.8  Vital Signs: Temp: 99 F (37.2 C) (12/09 1958) Temp Source: Oral (12/09 1958) BP: 181/73 (12/09 1958) Pulse Rate: 69 (12/09 1958)  Labs: No results for input(s): HGB, HCT, PLT, APTT, LABPROT, INR, HEPARINUNFRC, HEPRLOWMOCWT, CREATININE, CKTOTAL, CKMB, TROPONINIHS in the last 72 hours.  CrCl cannot be calculated (Patient's most recent lab result is older than the maximum 21 days allowed.).   Medical History: Past Medical History:  Diagnosis Date   Anxiety and depression    CAD (coronary artery disease)    Multivessel diagnosed at cardiac catheterization in 2022 - felt to be poor candidate for CABG and managed medically   Carotid artery disease    Status post TCAR 2023   Chronic diastolic (congestive) heart failure (HCC)    CKD (chronic kidney disease), stage III (HCC)    DVT (deep venous thrombosis) (HCC)    Iron deficiency anemia    Osteomyelitis (HCC)    PAD (peripheral artery disease)    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 on the left April 2025 - [facility]   Primary hypertension    TIA (transient ischemic attack)    Type 2 diabetes mellitus (HCC)     Medications:   Assessment: 57 YOF with prior cardiac history transferred from [facility] on heparin  infusion for medical management of ACS.   Heparin  infusion running at 1150 units/hr on transfer from [facility] and it seems it has been running at this rate since this morning. Ordering heparin  level now to adjust infusion per our protocol.  Goal of Therapy:  Heparin  level 0.3-0.7 units/ml Monitor platelets by  anticoagulation protocol: Yes   Plan:  Continue heparin  infusion at 1150 units/hr Check anti-Xa level now and adjust infusion if needed. Continue to monitor H&H and platelets  Hannah [name] 12/24/2023,8:34 PM

## 2023-12-25 ENCOUNTER — Other Ambulatory Visit: Payer: Self-pay

## 2023-12-25 ENCOUNTER — Encounter (HOSPITAL_COMMUNITY): Payer: Self-pay | Admitting: Cardiology

## 2023-12-25 DIAGNOSIS — I214 Non-ST elevation (NSTEMI) myocardial infarction: Principal | ICD-10-CM

## 2023-12-25 LAB — BASIC METABOLIC PANEL WITH GFR
Anion gap: 8 (ref 5–15)
BUN: 17 mg/dL (ref 8–23)
CO2: 25 mmol/L (ref 22–32)
Calcium: 8.3 mg/dL — ABNORMAL LOW (ref 8.9–10.3)
Chloride: 109 mmol/L (ref 98–111)
Creatinine, Ser: 1.46 mg/dL — ABNORMAL HIGH (ref 0.44–1.00)
GFR, Estimated: 40 mL/min — ABNORMAL LOW (ref >60)
Glucose, Bld: 73 mg/dL (ref 70–99)
Potassium: 3.1 mmol/L — ABNORMAL LOW (ref 3.5–5.1)
Sodium: 142 mmol/L (ref 135–145)

## 2023-12-25 LAB — GLUCOSE, CAPILLARY
Glucose-Capillary: 64 mg/dL — ABNORMAL LOW (ref 70–99)
Glucose-Capillary: 65 mg/dL — ABNORMAL LOW (ref 70–99)
Glucose-Capillary: 84 mg/dL (ref 70–99)
Glucose-Capillary: 123 mg/dL — ABNORMAL HIGH (ref 70–99)
Glucose-Capillary: 185 mg/dL — ABNORMAL HIGH (ref 70–99)
Glucose-Capillary: 132 mg/dL — ABNORMAL HIGH (ref 70–99)

## 2023-12-25 LAB — MRSA NEXT GEN BY PCR, NASAL: MRSA by PCR Next Gen: DETECTED — AB

## 2023-12-25 LAB — HEPARIN LEVEL (UNFRACTIONATED): Heparin Unfractionated: 0.3 [IU]/mL (ref 0.30–0.70)

## 2023-12-25 MED ORDER — MEDIHONEY WOUND/BURN DRESSING EX PSTE
1.0000 | PASTE | Freq: Every day | CUTANEOUS | Status: DC
  Filled 2023-12-25: qty 44

## 2023-12-25 MED ORDER — MUPIROCIN 2 % EX OINT
1.0000 | TOPICAL_OINTMENT | Freq: Two times a day (BID) | CUTANEOUS | Status: DC
  Administered 2023-12-25 – 2023-12-26 (×3): 1 via NASAL
  Filled 2023-12-25: qty 22

## 2023-12-25 MED ORDER — POTASSIUM CHLORIDE CRYS ER 20 MEQ PO TBCR
40.0000 meq | EXTENDED_RELEASE_TABLET | Freq: Two times a day (BID) | ORAL | Status: AC
  Administered 2023-12-25 (×2): 40 meq via ORAL
  Filled 2023-12-25 (×2): qty 2

## 2023-12-25 MED ORDER — INSULIN ASPART 100 UNIT/ML IJ SOLN
0.0000 [IU] | Freq: Three times a day (TID) | INTRAMUSCULAR | Status: DC
  Administered 2023-12-25: 4 [IU] via SUBCUTANEOUS
  Administered 2023-12-25: 1 [IU] via SUBCUTANEOUS
  Filled 2023-12-25: qty 4
  Filled 2023-12-25: qty 1

## 2023-12-25 MED ORDER — CHLORHEXIDINE GLUCONATE CLOTH 2 % EX PADS
6.0000 | MEDICATED_PAD | Freq: Every day | CUTANEOUS | Status: DC
  Administered 2023-12-25: 6 via TOPICAL

## 2023-12-25 MED ORDER — INSULIN GLARGINE 100 UNIT/ML ~~LOC~~ SOLN
10.0000 [IU] | Freq: Every day | SUBCUTANEOUS | Status: DC
  Administered 2023-12-25: 10 [IU] via SUBCUTANEOUS
  Filled 2023-12-25 (×2): qty 0.1

## 2023-12-25 MED ORDER — COLLAGENASE 250 UNIT/GM EX OINT
TOPICAL_OINTMENT | Freq: Every day | CUTANEOUS | Status: DC
  Filled 2023-12-25: qty 30

## 2023-12-25 NOTE — Progress Notes (Addendum)
 Progress Note  Patient Name: Hannah [name] Date of Encounter: 12/25/2023 [facility] Cardiologist: ALM CUNAS, MD   Interval Summary   Resting comfortably on the edge of the bed eating breakfast Had some issues with low blood sugar this morning, most recent 84 Reports no significant symptoms, some mild chest tightness Remains on IV heparin , we will continue this for 48 hours total Rediscussed plan with patient, no indication for cardiac cath at this time She is agreeable to this plan, eager to go home  Vital Signs Vitals:   12/24/23 2350 12/25/23 0350 12/25/23 0754 12/25/23 1150  BP: (!) 142/62 (!) 145/65 (!) 164/70 (!) 164/70  Pulse: 66 64 69   Resp: 13 10    Temp: 98.6 F (37 C) 98.1 F (36.7 C) 98.1 F (36.7 C) 97.7 F (36.5 C)  TempSrc: Oral Oral Oral Oral  SpO2: 95% 96% 98%   Weight:      Height:        Intake/Output Summary (Last 24 hours) at 12/25/2023 1222 Last data filed at 12/25/2023 0700 Gross per 24 hour  Intake 0 ml  Output 500 ml  Net -500 ml      12/24/2023    6:46 PM 09/02/2016    4:51 AM 08/31/2016    5:54 AM  Last 3 Weights  Weight (lbs) 219 lb 12.8 oz 241 lb 9.6 oz 246 lb 3.2 oz  Weight (kg) 99.7 kg 109.589 kg 111.676 kg     Telemetry/ECG  Sinus rhythm, HR 70s- Personally Reviewed  Physical Exam  GEN: No acute distress.   Neck: No JVD Cardiac: RRR, soft systolic murmur.  Respiratory: Clear to auscultation bilaterally. GI: Soft, nontender, non-distended  MS: Chronic venous insufficiency  Assessment & Plan   NSTEMI Multivessel CAD Hyperlipidemia  LHC ([place]) from 2022: severe multivessel disease, not a surgical candidate at that time Presented to [facility] with abdominal pain, nausea and emesis, as well as intermittent chest discomfort  High-sensitivity troponin I level peaked at 2.13, ECG showed nonspecific ST changes  Echo: LVEF 55 to 60% with mild to moderate LVH, no specific regional wall motion  abnormalities, trivial mitral regurgitation, and mild aortic valve sclerosis without stenosis  She was transferred from [facility] hospital to [facility] for cardiac cath Upon arrival our team has decided that given no symptoms, known CAD that is not a good candidate for surgical revascularization, severe PAD, CKD stage 4, type 2 diabetes, carotid artery disease s/p TCAR, recurrent  osteomyelitis with recent amputations and currently residing in a nursing facility with very limited mobility status she would not be a good candidate for repeat cardiac cath Treat with IV heparin  x 48 hours  Continue DAPT with ASA + Plavix  Continue Lipitor 80 mg daily   Hypertension BP currently 140s/60s Continue amlodipine  5 mg daily Continue bisoprolol  2.5 mg daily Room to increase these medications  Type 2 diabetes HgA1c 8.9% this admission  Continue SSI while inpatient Consult to diabetes coordinator, appreciate recs   Severe PAD  Carotid artery disease s/p right TCAR  Continue statin Continue DAPT with ASA + Plavix   CKD stage IV  Creatinine 1.22 ? 1.46  GFR 49 ? 40 Continue to hold Lasix    For questions or updates, please contact [facility] Please consult [email or web address] for contact info under       Signed, Emeline FORBES Calender, DO   Patient seen and examined, note reviewed with the signed Advanced Practice Provider. I personally reviewed laboratory data, imaging  studies and relevant notes. I independently examined the patient and formulated the important aspects of the plan. I have personally discussed the plan with the patient and/or family. Comments or changes to the note/plan are indicated below.  HPI: Says that she had recurrent chest pain when she was eating grits today and felt like it was stuck in her chest. [place] this was the same feeling she had initially. Otherwise no complaints.   My Exam:  Physical Exam Vitals and nursing note reviewed.  Constitutional:      Appearance: Normal  appearance.  HENT:     Head: Normocephalic and atraumatic.  Eyes:     Conjunctiva/sclera: Conjunctivae normal.  Cardiovascular:     Rate and Rhythm: Normal rate and regular rhythm.  Pulmonary:     Effort: Pulmonary effort is normal.     Breath sounds: Normal breath sounds.  Skin:    Coloration: Skin is not jaundiced or pale.  Neurological:     Mental Status: She is alert.      Telemetry: sinus rhythm - Personally reviewed   Assessment & Plan:  Chest pain, possibly GI - reportedly felt like her food got stuck in her chest. SLP evaluation NSTEMI with known history of multivessel CAD and poor candidacy for intervention, unclear if Type 1 or Type 2 - as above Hyperlipidemia Hypertension - poorly controlled. Did not receive her AM meds during my evaluation yet. Goal BP < 130/80 Type 2 diabetes Severe PAD CKD IV Carotid artery disease s/p right TCAR   Time coordinating patient care: 37 minutes  Signed, Emeline Calender, DO [place]  [facility]  12/25/2023 12:22 PM

## 2023-12-25 NOTE — Consult Note (Signed)
 WOC Nurse Consult Note: Reason for Consult: Asked to assess chronic nonhealing wounds in the setting of severe PAD, CKD stage 4, type 2 diabetes, carotid artery disease s/p TCAR, recurrent  osteomyelitis with recent amputations left 4th and 5th ray due to chronic osteomyelitis.  ABI LEFT 0.55.  Wound type: vascular Pressure Injury POA: NA Measurement: see nursing flowsheet Wound bed: All with devitalized tissue. Have been using a collagen dressing which is not effective at this time with slough present.  Drainage (amount, consistency, odor)  minimal serosanguinous   Periwound: Dry thin skin  Dressing procedure/placement/frequency: Cleanse wounds to left posterior malleolus, left dorsal foot, left lateral foot with NS and pat dry. Apply medihoney to wound bed.  Cover with foam dressing.  Reapply daily. Change foam every three days and PRN soilage.  Will not follow at this time.  Please re-consult if needed.  Darice Cooley MSN, RN, FNP-BC CWON Wound, Ostomy, Continence Nurse Outpatient [facility] [phone] Work cell phone:  [phone]

## 2023-12-25 NOTE — Progress Notes (Signed)
 PHARMACY - ANTICOAGULATION CONSULT NOTE  Pharmacy Consult for Heparin   Indication: chest pain/ACS  Allergies  Allergen Reactions   Naproxen Other (See Comments)    Emotional Reaction Cramps    Patient Measurements: Height: (P) 5' 8 (172.7 cm) Weight: (P) 99.7 kg (219 lb 12.8 oz) IBW/kg (Calculated) : (P) 63.9 HEPARIN  DW (KG): (P) 85.8  Vital Signs: Temp: 98.1 F (36.7 C) (12/10 0350) Temp Source: Oral (12/10 0350) BP: 145/65 (12/10 0350) Pulse Rate: 64 (12/10 0350)  Labs: Recent Labs    12/24/23 2109 12/25/23 0440  HGB 9.7*  --   HCT 30.0*  --   PLT 205  --   HEPARINUNFRC 0.40 0.30  CREATININE 1.22* 1.46*  TROPONINIHS 624*  --     CrCl cannot be calculated (Unknown ideal weight.).   Medical History: Past Medical History:  Diagnosis Date   Anxiety and depression    CAD (coronary artery disease)    Multivessel diagnosed at cardiac catheterization in 2022 - felt to be poor candidate for CABG and managed medically   Carotid artery disease    Status post TCAR 2023   Chronic diastolic (congestive) heart failure (HCC)    CKD (chronic kidney disease), stage III (HCC)    DVT (deep venous thrombosis) (HCC)    Iron deficiency anemia    Osteomyelitis (HCC)    PAD (peripheral artery disease)    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 on the left April 2025 - [facility]   Primary hypertension    TIA (transient ischemic attack)    Type 2 diabetes mellitus (HCC)     Assessment: 51 YOF with prior cardiac history transferred from [facility] on heparin  infusion for medical management of ACS. No AC PTA.  Heparin  level therapeutic at 0.3.  Goal of Therapy:  Heparin  level 0.3-0.7 units/ml Monitor platelets by anticoagulation protocol: Yes   Plan:  Increase heparin  1250 units/h to keep therapeutic Daily heparin  level and CBC   Ozell Jamaica, PharmD, BCPS, [facility] Clinical Pharmacist [phone] Please check AMION for all [facility] Pharmacy  numbers 12/25/2023

## 2023-12-25 NOTE — TOC Initial Note (Addendum)
 Transition of Care [facility]) - Initial/Assessment Note    Patient Details  Name: Hannah [name] MRN: [id number] Date of Birth: 1960-09-14  Transition of [facility]) CM/SW Contact:    Isaiah Public, LCSWA Phone Number: 12/25/2023, 10:27 AM  Clinical Narrative:                  CSW spoke with patient. Patient reports PTA she comes from [facility]. Patient confirmed with CSW that her plan is to return back to facility when medically ready for dc. CSW confirmed with Lauren in admissions that facility can accept patient back when medically ready. All questions answered. No further questions reported at this time.    Update- CSW received consult for patient. Patient had some financial questions for facility. CSW informed Lauren in admissions. Lauren informed CSW that facility can give patient a call to help with any questions she may have. Patient informed. Patient agreeable for facility to give her a call. CSW informed Lauren with facility. No further questions reported at this time.  Expected Discharge Plan: Skilled Nursing Facility Barriers to Discharge: Continued Medical Work up   Patient Goals and CMS Choice Patient states their goals for this hospitalization and ongoing recovery are:: SNF          Expected Discharge Plan and Services In-house Referral: Clinical Social Work     Living arrangements for the past 2 months: Skilled Nursing Facility                                      Prior Living Arrangements/Services Living arrangements for the past 2 months: Skilled Nursing Facility Lives with:: Facility Resident Patient language and need for interpreter reviewed:: Yes Do you feel safe going back to the place where you live?: Yes      Need for Family Participation in Patient Care: Yes (Comment) Care giver support system in place?: Yes (comment)   Criminal Activity/Legal Involvement Pertinent to Current Situation/Hospitalization: No - Comment as  needed  Activities of Daily Living   ADL Screening (condition at time of admission) Independently performs ADLs?: Yes (appropriate for developmental age) Is the patient deaf or have difficulty hearing?: No Does the patient have difficulty seeing, even when wearing glasses/contacts?: No Does the patient have difficulty concentrating, remembering, or making decisions?: No  Permission Sought/Granted Permission sought to share information with : Case Manager, Magazine Features Editor, Family Supports Permission granted to share information with : Yes, Verbal Permission Granted  Share Information with NAME: [name]  Permission granted to share info w AGENCY: SNF  Permission granted to share info w Relationship: spouse  Permission granted to share info w Contact Information: Mirah Nevins (spouse) [phone]  Emotional Assessment   Attitude/Demeanor/Rapport: Gracious Affect (typically observed): Calm Orientation: : Oriented to Self, Oriented to Place, Oriented to  Time, Oriented to Situation Alcohol / Substance Use: Not Applicable Psych Involvement: No (comment)  Admission diagnosis:  Chest pain [R07.9] NSTEMI (non-ST elevated myocardial infarction) [facility]) [I21.4] Patient Active Problem List   Diagnosis Date Noted   NSTEMI (non-ST elevated myocardial infarction) (HCC) 12/24/2023   Labial abscess 08/21/2016   Cellulitis, perineum 08/21/2016   Abscess of left axilla 08/21/2016   Cellulitis of axilla, left 08/21/2016   Chronic diastolic CHF (congestive heart failure) (HCC) 08/21/2016   Supratherapeutic INR 08/21/2016   Iron deficiency anemia    Essential hypertension    DVT (deep venous thrombosis) (HCC)  Diabetes mellitus (HCC)    CKD (chronic kidney disease), stage III (HCC)    PCP:  [name], MD Pharmacy:   [facility] - [address] [place] - [address] [place] [address] Phone: [phone] Fax:  [phone]     Social Drivers of Health (SDOH) Social History: SDOH Screenings   Food Insecurity: No Food Insecurity (12/25/2023)  Housing: Low Risk  (12/25/2023)  Transportation Needs: No Transportation Needs (12/25/2023)  Utilities: Not At Risk (12/25/2023)  Financial Resource Strain: Low Risk (02/12/2023)   Received from [facility]  Social Connections: Moderately Isolated (12/25/2023)  Tobacco Use: High Risk (12/25/2023)   SDOH Interventions:     Readmission Risk Interventions     No data to display

## 2023-12-25 NOTE — Plan of Care (Signed)
  Problem: Education: Goal: Knowledge of General Education information will improve Description: Including pain rating scale, medication(s)/side effects and non-pharmacologic comfort measures Outcome: Progressing   Problem: Health Behavior/Discharge Planning: Goal: Ability to manage health-related needs will improve Outcome: Progressing   Problem: Clinical Measurements: Goal: Ability to maintain clinical measurements within normal limits will improve Outcome: Progressing Goal: Will remain free from infection Outcome: Progressing Goal: Diagnostic test results will improve Outcome: Progressing Goal: Respiratory complications will improve Outcome: Progressing Goal: Cardiovascular complication will be avoided Outcome: Progressing   Problem: Activity: Goal: Risk for activity intolerance will decrease Outcome: Progressing   Problem: Nutrition: Goal: Adequate nutrition will be maintained Outcome: Progressing   Problem: Coping: Goal: Level of anxiety will decrease Outcome: Progressing   Problem: Elimination: Goal: Will not experience complications related to bowel motility Outcome: Progressing Goal: Will not experience complications related to urinary retention Outcome: Progressing   Problem: Pain Managment: Goal: General experience of comfort will improve and/or be controlled Outcome: Progressing   Problem: Safety: Goal: Ability to remain free from injury will improve Outcome: Progressing   Problem: Skin Integrity: Goal: Risk for impaired skin integrity will decrease Outcome: Progressing   Problem: Education: Goal: Understanding of cardiac disease, CV risk reduction, and recovery process will improve Outcome: Progressing Goal: Individualized Educational Video(s) Outcome: Progressing   Problem: Activity: Goal: Ability to tolerate increased activity will improve Outcome: Progressing   Problem: Cardiac: Goal: Ability to achieve and maintain adequate cardiovascular  perfusion will improve Outcome: Progressing   Problem: Health Behavior/Discharge Planning: Goal: Ability to safely manage health-related needs after discharge will improve Outcome: Progressing   Problem: Education: Goal: Ability to describe self-care measures that may prevent or decrease complications (Diabetes Survival Skills Education) will improve Outcome: Progressing Goal: Individualized Educational Video(s) Outcome: Progressing   Problem: Coping: Goal: Ability to adjust to condition or change in health will improve Outcome: Progressing   Problem: Fluid Volume: Goal: Ability to maintain a balanced intake and output will improve Outcome: Progressing   Problem: Health Behavior/Discharge Planning: Goal: Ability to identify and utilize available resources and services will improve Outcome: Progressing Goal: Ability to manage health-related needs will improve Outcome: Progressing   Problem: Metabolic: Goal: Ability to maintain appropriate glucose levels will improve Outcome: Progressing   Problem: Nutritional: Goal: Maintenance of adequate nutrition will improve Outcome: Progressing Goal: Progress toward achieving an optimal weight will improve Outcome: Progressing   Problem: Skin Integrity: Goal: Risk for impaired skin integrity will decrease Outcome: Progressing   Problem: Tissue Perfusion: Goal: Adequacy of tissue perfusion will improve Outcome: Progressing

## 2023-12-25 NOTE — Inpatient Diabetes Management (Addendum)
 Inpatient Diabetes Program Recommendations  AACE/ADA: New Consensus Statement on Inpatient Glycemic Control   Target Ranges:  Prepandial:   less than 140 mg/dL      Peak postprandial:   less than 180 mg/dL (1-2 hours)      Critically ill patients:  140 - 180 mg/dL   Lab Results  Component Value Date   GLUCAP 84 12/25/2023   HGBA1C 8.9 (H) 12/24/2023    Latest Reference Range & Units 12/24/23 21:26 12/25/23 07:55 12/25/23 08:25 12/25/23 09:05  Glucose-Capillary 70 - 99 mg/dL 890 (H) 65 (L) 64 (L) 84   Review of Glycemic Control  Diabetes history: DM2  Outpatient Diabetes medications:  Lantus  24 units daily  Humulin R  Sliding Scale  Tradjenta 5mg  daily   Current orders for Inpatient glycemic control:  Lantus  12 units daily  Novolog  0-20 units TID   Inpatient Diabetes Program Recommendations:   Please consider decreasing Lantus  to 10 units daily and Novolog  correction to 0-9 units TID.   Addendum @ 1300: Spoke with patient at bedside. 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 Confirmed with patient she receives Lantus  and Humulin R  at SNF. Discussed A1C of 8.9%, and made her aware this is an average blood glucose of 209 mg/dl. Patient states that is really good for her because it has been 13% in the past. Discussed glucose and A1C goals. Discussed importance of checking CBGs and maintaining good CBG control to prevent long-term and short-term complications. Explained how hyperglycemia leads to damage within blood vessels which lead to the common complications seen with uncontrolled diabetes. Stressed to the patient the importance of improving glycemic control to prevent further complications from uncontrolled diabetes. Discussed impact of nutrition, stress, sickness, and medications on diabetes control. Discussed carbohydrates, carbohydrate goals per day and meal, along with portion sizes. Patient was very appreciative of education and  has no further questions at this time.   Thanks,  Lavanda Search, RN, MSN, [facility]  Inpatient Diabetes Coordinator  Pager [phone] (8a-5p)

## 2023-12-25 NOTE — Plan of Care (Signed)
°  Problem: Education: Goal: Knowledge of General Education information will improve Description: Including pain rating scale, medication(s)/side effects and non-pharmacologic comfort measures Outcome: Progressing   Problem: Health Behavior/Discharge Planning: Goal: Ability to manage health-related needs will improve Outcome: Progressing   Problem: Clinical Measurements: Goal: Ability to maintain clinical measurements within normal limits will improve Outcome: Progressing Goal: Will remain free from infection Outcome: Progressing Goal: Diagnostic test results will improve Outcome: Progressing Goal: Respiratory complications will improve Outcome: Progressing Goal: Cardiovascular complication will be avoided Outcome: Progressing   Problem: Activity: Goal: Risk for activity intolerance will decrease Outcome: Progressing   Problem: Nutrition: Goal: Adequate nutrition will be maintained Outcome: Progressing   Problem: Coping: Goal: Level of anxiety will decrease Outcome: Progressing   Problem: Elimination: Goal: Will not experience complications related to bowel motility Outcome: Progressing Goal: Will not experience complications related to urinary retention Outcome: Progressing   Problem: Pain Managment: Goal: General experience of comfort will improve and/or be controlled Outcome: Progressing   Problem: Safety: Goal: Ability to remain free from injury will improve Outcome: Progressing   Problem: Skin Integrity: Goal: Risk for impaired skin integrity will decrease Outcome: Progressing   Problem: Education: Goal: Understanding of cardiac disease, CV risk reduction, and recovery process will improve Outcome: Progressing Goal: Individualized Educational Video(s) Outcome: Progressing   Problem: Education: Goal: Individualized Educational Video(s) Outcome: Progressing   Problem: Education: Goal: Individualized Educational Video(s) Outcome: Progressing    Problem: Activity: Goal: Ability to tolerate increased activity will improve Outcome: Progressing   Problem: Cardiac: Goal: Ability to achieve and maintain adequate cardiovascular perfusion will improve Outcome: Progressing   Problem: Health Behavior/Discharge Planning: Goal: Ability to safely manage health-related needs after discharge will improve Outcome: Progressing   Problem: Education: Goal: Ability to describe self-care measures that may prevent or decrease complications (Diabetes Survival Skills Education) will improve Outcome: Progressing Goal: Individualized Educational Video(s) Outcome: Progressing   Problem: Coping: Goal: Ability to adjust to condition or change in health will improve Outcome: Progressing   Problem: Fluid Volume: Goal: Ability to maintain a balanced intake and output will improve Outcome: Progressing   Problem: Health Behavior/Discharge Planning: Goal: Ability to identify and utilize available resources and services will improve Outcome: Progressing Goal: Ability to manage health-related needs will improve Outcome: Progressing   Problem: Metabolic: Goal: Ability to maintain appropriate glucose levels will improve Outcome: Progressing   Problem: Nutritional: Goal: Maintenance of adequate nutrition will improve Outcome: Progressing Goal: Progress toward achieving an optimal weight will improve Outcome: Progressing   Problem: Skin Integrity: Goal: Risk for impaired skin integrity will decrease Outcome: Progressing   Problem: Tissue Perfusion: Goal: Adequacy of tissue perfusion will improve Outcome: Progressing

## 2023-12-25 NOTE — Evaluation (Signed)
 Clinical/Bedside Swallow Evaluation Patient Details  Name: Hannah [name] MRN: [id number] Date of Birth: [birth year]  Today's Date: 12/25/2023 Time: SLP Start Time (ACUTE ONLY): 1225 SLP Stop Time (ACUTE ONLY): 1245 SLP Time Calculation (min) (ACUTE ONLY): 20 min  Past Medical History:  Past Medical History:  Diagnosis Date   Anxiety and depression    CAD (coronary artery disease)    Multivessel diagnosed at cardiac catheterization in 2022 - felt to be poor candidate for CABG and managed medically   Carotid artery disease    Status post TCAR 2023   Chronic diastolic (congestive) heart failure (HCC)    CKD (chronic kidney disease), stage III (HCC)    DVT (deep venous thrombosis) (HCC)    Iron deficiency anemia    Osteomyelitis (HCC)    PAD (peripheral artery disease)    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 on the left April 2025 [facility]   Primary hypertension    TIA (transient ischemic attack)    Type 2 diabetes mellitus (HCC)    Past Surgical History:  Past Surgical History:  Procedure Laterality Date   ABDOMINAL HYSTERECTOMY     BACK SURGERY     CHOLECYSTECTOMY     TOE AMPUTATION     TRANSCAROTID ARTERY REVASCULARIZATION (TCAR)     VULVA MARYBETH BIOPSY N/A 08/29/2016   Procedure: Irrigation and Debridement with Insertion of Interuterine Device;  Surgeon: [name], MD;  Location: [facility] OR;  Service: Gynecology;  Laterality: N/A;  Irrigation and Debridement with Insertion of Interuterine Device   HPI:  Ms. Pieri is a medically complex 63 y.o. female, admitted from SNF where she had been residing for several months, to [facility] reportedly due to abdominal pain associated with nausea and emesis as well as diarrhea, inability to take her medications, and ultimately intermittent chest discomfort.   Per most recent cardiology notes, no plan for cardiac cath at this time. Reportedly transfered to [facility] for cardiac catherization. PMH of CAD,  anxiety/depression, CAD, CHF, DVT, TIA, HNT, DM2.    Assessment / Plan / Recommendation  Clinical Impression  Patient presents with a suspected primary esophageal dysphagia characterized by c/o globus with solid pos with an otherwise normal appearing oropharyngeal swallow. No overt s/s of aspiration observed across consistencies. No SLP f/u indicated. Provided patient with verbal and written esophageal precautions, verbalized understanding. Patient currently on med for GER. Defer further medication management to MD. If not resolved, consider f/u with GI. SLP Visit Diagnosis: Dysphagia, unspecified (R13.10)          Other Recommendations Oral Care Recommendations: Oral care BID     Swallow Evaluation Recommendations Recommendations: PO diet PO Diet Recommendation: Regular;Thin liquids (Level 0) (chopped meat per patient request) Liquid Administration via: Cup;Straw Medication Administration: Whole meds with liquid Supervision: Patient able to self-feed Swallowing strategies  : Slow rate;Small bites/sips;Follow solids with liquids Postural changes: Stay upright 30-60 min after meals;Position pt fully upright for meals Oral care recommendations: Oral care BID (2x/day) Recommended consults: Consider GI consultation      Functional Status Assessment Patient has not had a recent decline in their functional status    Swallow Study   General HPI: Ms. Kohut is a medically complex 63 y.o. female, admitted from SNF where she had been residing for several months, to [facility] reportedly due to abdominal pain associated with nausea and emesis as well as diarrhea, inability to take her medications, and ultimately intermittent chest discomfort.   Per most  recent cardiology notes, no plan for cardiac cath at this time. Reportedly transfered to [facility] for cardiac catherization. PMH of CAD, anxiety/depression, CAD, CHF, DVT, TIA, HNT, DM2. Type of Study: Bedside Swallow Evaluation Previous  Swallow Assessment: none per patient Diet Prior to this Study: Regular;Thin liquids (Level 0) Temperature Spikes Noted: No Respiratory Status: Room air History of Recent Intubation: No Behavior/Cognition: Alert;Cooperative;Pleasant mood Oral Cavity Assessment: Within Functional Limits Oral Care Completed by SLP: No Oral Cavity - Dentition: Edentulous Vision: Functional for self-feeding Self-Feeding Abilities: Able to feed self Patient Positioning:  (edge of bed) Baseline Vocal Quality: Normal Volitional Cough: Strong Volitional Swallow: Able to elicit    Oral/Motor/Sensory Function Overall Oral Motor/Sensory Function: Within functional limits   Ice Chips Ice chips: Not tested   Thin Liquid Thin Liquid: Within functional limits Presentation: Cup;Self Fed;Straw    Nectar Thick Nectar Thick Liquid: Not tested   Honey Thick Honey Thick Liquid: Not tested   Puree Puree: Within functional limits Presentation: Spoon;Self Fed   Solid     Solid: Within functional limits Presentation: Self Fed     Tashyra Adduci MA, CCC-SLP  Rihan Schueler Meryl 12/25/2023,12:52 PM

## 2023-12-25 NOTE — Progress Notes (Signed)
 Patient blood sugar of 65. Hypoglycemic protocol implement. Patient down to 64 on recheck. 4 more ounces of carbs given patient then >80 at recheck.

## 2023-12-26 ENCOUNTER — Other Ambulatory Visit (HOSPITAL_COMMUNITY): Payer: Self-pay

## 2023-12-26 ENCOUNTER — Inpatient Hospital Stay (HOSPITAL_COMMUNITY)

## 2023-12-26 DIAGNOSIS — I779 Disorder of arteries and arterioles, unspecified: Secondary | ICD-10-CM | POA: Insufficient documentation

## 2023-12-26 DIAGNOSIS — E785 Hyperlipidemia, unspecified: Secondary | ICD-10-CM

## 2023-12-26 HISTORY — DX: Hyperlipidemia, unspecified: E78.5

## 2023-12-26 LAB — GLUCOSE, CAPILLARY
Glucose-Capillary: 98 mg/dL (ref 70–99)
Glucose-Capillary: 95 mg/dL (ref 70–99)

## 2023-12-26 LAB — BASIC METABOLIC PANEL WITH GFR
Anion gap: 6 (ref 5–15)
BUN: 17 mg/dL (ref 8–23)
CO2: 26 mmol/L (ref 22–32)
Calcium: 8.1 mg/dL — ABNORMAL LOW (ref 8.9–10.3)
Chloride: 105 mmol/L (ref 98–111)
Creatinine, Ser: 1.03 mg/dL — ABNORMAL HIGH (ref 0.44–1.00)
GFR, Estimated: >60 mL/min (ref >60)
Glucose, Bld: 104 mg/dL — ABNORMAL HIGH (ref 70–99)
Potassium: 3.9 mmol/L (ref 3.5–5.1)
Sodium: 137 mmol/L (ref 135–145)

## 2023-12-26 LAB — HEPARIN LEVEL (UNFRACTIONATED): Heparin Unfractionated: 0.19 [IU]/mL — ABNORMAL LOW (ref 0.30–0.70)

## 2023-12-26 LAB — CBC
HCT: 29.5 % — ABNORMAL LOW (ref 36.0–46.0)
Hemoglobin: 9.7 g/dL — ABNORMAL LOW (ref 12.0–15.0)
MCH: 29.7 pg (ref 26.0–34.0)
MCHC: 32.9 g/dL (ref 30.0–36.0)
MCV: 90.2 fL (ref 80.0–100.0)
Platelets: 232 K/uL (ref 150–400)
RBC: 3.27 MIL/uL — ABNORMAL LOW (ref 3.87–5.11)
RDW: 14.1 % (ref 11.5–15.5)
WBC: 10 K/uL (ref 4.0–10.5)
nRBC: 0 % (ref 0.0–0.2)

## 2023-12-26 LAB — LIPOPROTEIN A (LPA): Lipoprotein (a): 29.7 nmol/L (ref <75.0)

## 2023-12-26 MED ORDER — ISOSORBIDE MONONITRATE ER 30 MG PO TB24
15.0000 mg | ORAL_TABLET | Freq: Every day | ORAL | Status: DC
  Administered 2023-12-26: 15 mg via ORAL
  Filled 2023-12-26: qty 1

## 2023-12-26 MED ORDER — PANTOPRAZOLE SODIUM 40 MG PO TBEC
40.0000 mg | DELAYED_RELEASE_TABLET | Freq: Every day | ORAL | Status: DC
  Administered 2023-12-26: 40 mg via ORAL
  Filled 2023-12-26: qty 1

## 2023-12-26 MED ORDER — NITROGLYCERIN 0.4 MG SL SUBL
0.4000 mg | SUBLINGUAL_TABLET | SUBLINGUAL | 2 refills | Status: AC | PRN
  Filled 2023-12-26: qty 25, 7d supply, fill #0

## 2023-12-26 MED ORDER — LACTATED RINGERS IV BOLUS
250.0000 mL | Freq: Once | INTRAVENOUS | Status: AC
  Administered 2023-12-26: 250 mL via INTRAVENOUS

## 2023-12-26 MED FILL — Aspirin Tab Delayed Release 81 MG: 81.0000 mg | ORAL | 30 days supply | Qty: 30 | Fill #0 | Status: CN

## 2023-12-26 MED FILL — Amlodipine Besylate Tab 5 MG (Base Equivalent): 2.5000 mg | ORAL | 100 days supply | Qty: 50 | Fill #0 | Status: CN

## 2023-12-26 MED FILL — Isosorbide Mononitrate Tab ER 24HR 30 MG: 15.0000 mg | ORAL | 100 days supply | Qty: 50 | Fill #0 | Status: CN

## 2023-12-26 NOTE — Discharge Summary (Addendum)
 Discharge Summary   Patient ID: Hannah [name] MRN: [id number]; DOB: Mar 28, 1960  Admit date: 12/24/2023 Discharge date: 12/26/2023  PCP:  [name], MD   [facility] Providers Cardiologist:  ALM CUNAS, MD   patient going to establish with our [place] office  Discharge Diagnoses  Principal Problem:   NSTEMI (non-ST elevated myocardial infarction) [facility]) Active Problems:   Essential hypertension   Type 2 diabetes mellitus (HCC)   CKD (chronic kidney disease), stage III (HCC)   Carotid artery disease   Hyperlipidemia  Diagnostic Studies/Procedures   Echocardiogram, performed at [facility] LVEF of 55 to 60% with mild to moderate LVH, no specific regional wall motion abnormalities, trivial mitral regurgitation, and mild aortic valve sclerosis without stenosis  _____________   History of Present Illness   Hannah [name] is a 63 y.o. 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  She was sent to [facility] from [facility] to undergo further evaluation and treatment of NSTEMI.  Hospital Course   She arrived from [facility] hospital on 12/24/2023 to [facility] to undergo further treatment for NSTEMI.  Upon arrival to the hospital it was noted that she would likely be a poor candidate for repeat cardiac catheterization given her multiple comorbidities, limited mobility status, known multivessel CAD that was not a candidate for surgical revascularization.  She was treated with IV heparin  x 48 hours, continued on DAPT with aspirin  and Plavix , continued on Lipitor 80 mg daily.  For hypertension she was continued on her amlodipine  5 mg daily, bisoprolol  2.5 mg daily.  She remained to have systolic BP in the 140s  to 150s.  She was started on Imdur 50 mg daily on 12/26/2023.  In regards to her type 2 diabetes she was started on sliding scale insulin  while in the hospital.  She was seen by the inpatient diabetes coordinator who made the following recommendations in regards to her diabetes.  Recommended decreasing Lantus  to 10 units daily and NovoLog  correction 2 0 to 9 units 3 times daily.  Her outpatient medications include Lantus  24 units daily, Humulin R  sliding scale, Tradjenta 5 mg daily.  Will recommend she follows up with her PCP at discharge to ensure she is having appropriate control of her diabetes.  Throughout the course of her hospitalization she has been struggling with some episodes of dysphagia.  She was seen by a speech and language pathologist on 12/25/2023 to undergo bedside swallow evaluation.  SLP note indicates that the patient had no overt signs and symptoms of aspiration observed across consistencies, and did not recommend any further evaluation.  Patient continued to experience food getting stuck in her throat, noted it happened with grapes overnight and was resolved once she had vomited.  Reached out to our gastrointestinal colleagues who recommended inpatient barium swallow study.   Her barium swallow study showed: no evidence of esophageal obstruction or stricture, distal esophageal fold thickening, suspicious for esophagitis, moderate to severe esophageal dysmotility  Treatment at discharge is as follows:  NSTEMI Multivessel CAD Hyperlipidemia  Continue DAPT with ASA + Plavix  Continue Lipitor 80 mg daily  Continue Imdur 15 mg    Hypertension Continue amlodipine  2.5 mg daily Continue bisoprolol  2.5 mg daily Continue Imdur 15 mg    Type 2 diabetes HgA1c 8.9% this admission  Continue home medications:  Lantus  24 units daily  Humulin R  Sliding Scale  Tradjenta 5mg  daily  Recommend following up closely with PCP to ensure she has strict control of her diabetes   Severe PAD   Carotid artery disease s/p right TCAR  Continue Lipitor 80 mg daily  Continue DAPT with ASA + Plavix   Dysphagia  Moderate to severe esophageal dysmotility Possible esophagitis Starting Protonix 40 mg daily Discussed with our GI team who recommended: patient to chop her food to a very fine consistency, take a swallow of liquid following each bite of food, eat slowly and always be in an upright position when eating. Will proceed with outpatient GI workup  Did the patient have an acute coronary syndrome (MI, NSTEMI, STEMI, etc) this admission?:  Yes                               AHA/ACC ACS Clinical Performance & Quality Measures: Aspirin  prescribed? - Yes ADP Receptor Inhibitor (Plavix /Clopidogrel , Brilinta/Ticagrelor or Effient/Prasugrel) prescribed (includes medically managed patients)? - Yes Beta Blocker prescribed? - Yes High Intensity Statin (Lipitor 40-80mg  or Crestor 20-40mg ) prescribed? - Yes EF assessed during THIS hospitalization? - Yes For EF <40%, was ACEI/ARB prescribed? - Not Applicable (EF >/= 40%) For EF <40%, Aldosterone Antagonist (Spironolactone or Eplerenone) prescribed? - Not Applicable (EF >/= 40%) Cardiac Rehab Phase II ordered (including medically managed patients)? - No - patient is wanting to follow back up in [place]  _____________  Discharge Vitals Blood pressure 129/60, pulse 60, temperature 98.9 F (37.2 C), temperature source Oral, resp. rate 17, height (P) 5' 8 (1.727 m), weight (P) 99.7 kg, SpO2 100%.  Filed Weights   12/24/23 1846  Weight: (P) 99.7 kg   Labs & Radiologic Studies  CBC Recent Labs    12/24/23 2109 12/26/23 0442  WBC 9.9 10.0  HGB 9.7* 9.7*  HCT 30.0* 29.5*  MCV 93.2 90.2  PLT 205 232   Basic Metabolic Panel Recent Labs    87/89/74 0440 12/26/23 0442  NA 142 137  K 3.1* 3.9  CL 109 105  CO2 25 26  GLUCOSE 73 104*  BUN 17 17  CREATININE 1.46* 1.03*  CALCIUM  8.3* 8.1*   Liver Function Tests No results for  input(s): AST, ALT, ALKPHOS, BILITOT, PROT, ALBUMIN in the last 72 hours. No results for input(s): LIPASE, AMYLASE in the last 72 hours. High Sensitivity Troponin:   Recent Labs  Lab 12/24/23 2109  TROPONINIHS 624*    No results for input(s): TRNPT in the last 720 hours.  BNP Invalid input(s): POCBNP No results for input(s): PROBNP in the last 72 hours.  No results for input(s): BNP in the last 72 hours.  D-Dimer No results for input(s): DDIMER in the last 72 hours. Hemoglobin A1C Recent Labs    12/24/23 2109  HGBA1C 8.9*   Fasting Lipid Panel No results for input(s): CHOL, HDL, LDLCALC, TRIG, CHOLHDL, LDLDIRECT in the last 72 hours. Lipoprotein (a)  Date/Time Value Ref Range Status  12/25/2023 04:40 AM 29.7 <75.0 nmol/L Final    Comment:    (NOTE) This test was developed and its performance characteristics determined by [facility]. It has not been cleared or approved by the Food and Drug Administration. Note:  Values greater than or equal to 75.0 nmol/L may       indicate an independent risk factor for CHD,       but must be evaluated with  caution when applied       to non-Caucasian populations due to the       influence of genetic factors on Lp(a) across       ethnicities. Performed At: [facility] [address] [place], [place] [id number] Jennette Shorter MD Ey:[phone]     Thyroid Function Tests No results for input(s): TSH, T4TOTAL, T3FREE, THYROIDAB in the last 72 hours.  Invalid input(s): FREET3 _____________  ARCOLA ESOPHAGUS W SINGLE CM (SOL OR THIN BA) Result Date: 12/26/2023 CLINICAL DATA:  63 year old female admitted for cardiac concerns (NSTEMI) with abdominal pain, nausea and vomiting. Patient also reports globus sensation. EXAM: ESOPHAGUS/BARIUM SWALLOW/TABLET STUDY TECHNIQUE: Single contrast examination was performed using thin liquid barium. This exam was performed by Carlin Griffon, PA-C, and was  supervised and interpreted by Dr. Norleen Kil, MD. FLUOROSCOPY: Radiation Exposure Index (as provided by the fluoroscopic device): 38.80 mGy Kerma COMPARISON:  None Available. FINDINGS: Study was technically limited by patient's inability to cooperate with positioning and rapid bolus swallowing during the exam. Imaging partially performed at 15 degrees table tilt. Swallowing: Appears normal. No vestibular penetration or aspiration seen. Esophagus: No evidence of esophageal obstruction or stricture. Longitudinal fold thickening noted in the distal thoracic esophagus, suspicious for esophagitis. Esophageal motility: Moderate to severe esophageal dysmotility, with poor primary peristalsis, notable proximal escape, and tertiary contractions. Hiatal Hernia: None. Gastroesophageal reflux: None visualized, however, not well evaluated as patient could not tolerate fluoroscopy table being positioned flat. Ingested 13mm barium tablet: Declined by patient. IMPRESSION: No evidence of esophageal obstruction or stricture. Distal esophageal fold thickening, suspicious for esophagitis. Moderate to severe esophageal dysmotility. Electronically Signed   By: Norleen DELENA Kil M.D.   On: 12/26/2023 13:16   Disposition Pt is being discharged home today in good condition.  Follow-up Plans & Appointments  Patient lives in [place]  She is discharging to a SNF in [place] Follow-up will be made with outpatient cardiology office in [place] as below  Discussed that she needs to find outpatient GI provider to see for further evaluation and treatment Offered her our [place] office however she does not want to travel here  Future Appointments  Date Time Provider Department Center  01/14/2024 10:20 AM Bernie Lamar PARAS, MD [facility] None   Discharge Medications Allergies as of 12/26/2023       Reactions   Aleve [naproxen] Other (See Comments)   Emotional Reaction Cramping   Maxipime [cefepime] Other (See  Comments)   Unknown reaction        Medication List     STOP taking these medications    meloxicam 15 MG tablet Commonly known as: MOBIC       TAKE these medications    acetaminophen  500 MG tablet Commonly known as: TYLENOL  Take 1,000 mg by mouth 3 (three) times daily.   amLODipine  5 MG tablet Commonly known as: NORVASC  Take 0.5 tablets (2.5 mg total) by mouth daily. Start taking on: December 27, 2023   aspirin  EC 81 MG tablet Take 1 tablet (81 mg total) by mouth daily. Swallow whole. Start taking on: December 27, 2023   atorvastatin  80 MG tablet Commonly known as: LIPITOR Take 80 mg by mouth at bedtime.   Bisoprolol  Fumarate 2.5 MG Tabs Take 2.5 mg by mouth daily.   clonazePAM  0.5 MG tablet Commonly known as: KLONOPIN  Take 0.5 mg by mouth at bedtime.   clopidogrel  75 MG tablet Commonly known as: PLAVIX  Take 75 mg by mouth daily.   cyclobenzaprine  10  MG tablet Commonly known as: FLEXERIL  Take 10 mg by mouth 3 (three) times daily.   famotidine  20 MG tablet Commonly known as: PEPCID  Take 20 mg by mouth daily.   ferrous sulfate  324 MG Tbec Take 324 mg by mouth daily at 12 noon.   ferrous sulfate  325 (65 FE) MG tablet Take 325 mg by mouth daily.   furosemide 40 MG tablet Commonly known as: LASIX Take 40 mg by mouth 2 (two) times daily.   gabapentin  300 MG capsule Commonly known as: NEURONTIN  Take 300 mg by mouth 3 (three) times daily.   glucagon Emergency 1 MG Solr Inject 1 mg into the muscle as needed (BG < 70). *if patient is not arousable, conscious or unable to swallow. Hold all diabetic medications until provider authorizes resumption. Remain with patient, keep in bed or chair for safety - repeat BG in 15 minutes.   HEPARIN  SODIUM FLUSH IV Inject 250 mLs into the vein See admin instructions. Heparin  Sodium/Sodium Chloride ; administer 250mL via IV @10mL /hr every 24 hours.   HYDROcodone-acetaminophen  5-325 MG tablet Commonly known as:  NORCO/VICODIN Take 1 tablet by mouth every 6 (six) hours as needed (pain).   Insta-Glucose 77.4 % Gel Take 1 Dose by mouth as needed (BS < 70). *if patient is arousable and able to swallow. Hold all diabetic medications until provider authorizes resumption. Remain with patient, keep patient in bed or chair for safety - repeat BG in 15 minutes.   insulin  lispro 100 UNIT/ML injection Commonly known as: HUMALOG Inject 0-12 Units into the skin See admin instructions. Administer 0-12 units into the skin three times daily before meals per sliding scale: 000 - 200 : 0 unit 201 - 250 : 4 units 251 - 300 : 6 units 301 - 350 : 8 units 351 - 400 : 10 units 401 - 450 : 12 units, call MD.   Insulin  Regular Human 100 UNIT/ML KwikPen Commonly known as: HUMULIN R  Inject as directed.   isosorbide mononitrate 30 MG 24 hr tablet Commonly known as: IMDUR Take 0.5 tablets (15 mg total) by mouth daily. Start taking on: December 27, 2023   Lantus  SoloStar 100 UNIT/ML Solostar Pen Generic drug: insulin  glargine Inject 24 Units into the skin at bedtime.   linagliptin 5 MG Tabs tablet Commonly known as: TRADJENTA Take 5 mg by mouth daily.   meclizine 25 MG tablet Commonly known as: ANTIVERT Take 25 mg by mouth every 8 (eight) hours as needed (vertigo).   nitroGLYCERIN  0.4 MG SL tablet Commonly known as: NITROSTAT  Place 1 tablet (0.4 mg total) under the tongue every 5 (five) minutes x 3 doses as needed for chest pain.   pantoprazole 40 MG tablet Commonly known as: PROTONIX Take 40 mg by mouth daily.   pregabalin  100 MG capsule Commonly known as: LYRICA  Take 100 mg by mouth 2 (two) times daily.   promethazine 25 MG/ML injection Commonly known as: PHENERGAN Inject 25 mg into the muscle once as needed for nausea or vomiting.   senna 8.6 MG Tabs tablet Commonly known as: SENOKOT Take 17.2 mg by mouth every 12 (twelve) hours as needed (constipation).   sertraline  25 MG tablet Commonly known  as: ZOLOFT  Take 25 mg by mouth daily.   sodium chloride  0.9 % infusion Inject 1,000 mLs into the vein See admin instructions. Administer 1000mL via IV @100mL /hr every 24 hours.   traMADol  50 MG tablet Commonly known as: ULTRAM  Take 1 tablet (50 mg total) by mouth every 12 (twelve)  hours as needed. What changed: when to take this   VITAMIN B COMPLEX PO Take by mouth.               Discharge Care Instructions  (From admission, onward)           Start     Ordered   12/26/23 0000  Leave dressing on - Keep it clean, dry, and intact until clinic visit        12/26/23 1403            Outstanding Labs/Studies N/A  Duration of Discharge Encounter: APP Time: 25 minutes   Signed, Waddell DELENA Donath, PA-C 12/26/2023, 2:16 PM   Patient seen and examined, note reviewed with the signed Advanced Practice Provider. I personally reviewed laboratory data, imaging studies and relevant notes. I independently examined the patient and formulated the important aspects of the plan. I have personally discussed the plan with the patient and/or family. Comments or changes to the note/plan are indicated below.  See my progress note attestation from earlier today for full details. She underwent a barium swallow study today after recurrent dysphagia overnight. Discussed with GI with plan as above.   My Exam:  Physical Exam Vitals and nursing note reviewed.  Constitutional:      Appearance: Normal appearance.  HENT:     Head: Normocephalic and atraumatic.  Eyes:     Conjunctiva/sclera: Conjunctivae normal.  Cardiovascular:     Rate and Rhythm: Normal rate and regular rhythm.  Pulmonary:     Effort: Pulmonary effort is normal.     Breath sounds: Normal breath sounds.  Musculoskeletal:        General: No swelling or tenderness.  Skin:    Coloration: Skin is not jaundiced or pale.  Neurological:     Mental Status: She is alert.      Telemetry: NSR - Personally  reviewed   Assessment & Plan:   Chest pain, possibly GI - reportedly felt like her food got stuck in her chest. SLP evaluation was unrevealing. Had repeat symptoms last night leading to barium swallow which was negative for stricture but showed moderate to severe dysmotility and concern for esophagitis. Will start on daily PPI and have the patient follow up with GI outpatient.  NSTEMI with known history of multivessel CAD and poor candidacy for intervention, unclear if Type 1 or Type 2 - see APP note above. Not in heart failure. Started on Imdur 15 mg Hyperlipidemia Hypertension with an episode of hypotension - received all BP meds at once and stood up for the first time in days. Given 250 mL bolus IV fluids with improvement. Reduce amlodipine  to 2.5 mg daily now that she is on imdur 15 with the episode of hypotension Type 2 diabetes Severe PAD CKD IV Carotid artery disease s/p right TCAR   Time coordinating patient care: 75 minutes total care today.   SignedEmeline Calender, DO [place]  [facility]  12/26/2023 2:18 PM

## 2023-12-26 NOTE — TOC Progression Note (Addendum)
 Transition of Care [facility]) - Progression Note    Patient Details  Name: Hannah [name] MRN: [id number] Date of Birth: 01-25-1960  [facility]) CM/SW Contact  Isaiah Public, LCSWA Phone Number: 12/26/2023, 12:58 PM  Clinical Narrative:     CSW spoke with patient about PT recommendations for SNF . Patient agreeable to receive some short term rehab when she returns back to [facility] re[facility]. CSW informed Lauren with [facility]. CSW requested for Natalie CMA to start insurance authorization for patient. CSW will continue to follow.  Update- Lauren with admissions informed CSW that patient can return to facility if medically ready before insurance authorization received, as long as it has already been submitted.   Update- Natalie B. CMA informed CSW that patients insurance authorization has been submitted and currently pending. Insurance Auth ID # [id number] .  UpdateGLENWOOD Edelman CMA informed CSW that Patients insurance authorization has been approved.insurance authorization has been approved from 12/26/2023-12/30/2023. NRD 12/15.   Expected Discharge Plan: Skilled Nursing Facility Barriers to Discharge: Continued Medical Work up               Expected Discharge Plan and Services In-house Referral: Clinical Social Work     Living arrangements for the past 2 months: Skilled Nursing Facility                                       Social Drivers of Health (SDOH) Interventions SDOH Screenings   Food Insecurity: No Food Insecurity (12/25/2023)  Housing: Low Risk (12/25/2023)  Transportation Needs: No Transportation Needs (12/25/2023)  Utilities: Not At Risk (12/25/2023)  Financial Resource Strain: Low Risk (02/12/2023)   Received from [facility]  Social Connections: Moderately Isolated (12/25/2023)  Tobacco Use: High Risk (12/25/2023)    Readmission Risk Interventions     No data to display

## 2023-12-26 NOTE — TOC Transition Note (Signed)
 Transition of Care [facility]) - Discharge Note   Patient Details  Name: Hannah [name] MRN: [id number] Date of Birth: 1960-06-29  Transition of Care [facility]) CM/SW Contact:  Isaiah Public, LCSWA Phone Number: 12/26/2023, 2:18 PM   Clinical Narrative:     Patient will DC to: [facility]   Anticipated DC date: 12/26/2023  Family notified:Patient informed CSW no need to notify.Patient informed CSW that she notified spouse.   Transport by: [facility]  ?  Per MD patient ready for DC to [facility] SNF . RN, patient, patient's family, and facility notified of DC. Discharge Summary sent to facility. RN given number for report [phone], RM# [phone]. DC packet on chart. Ambulance transport requested for patient.  CSW signing off.   Final next level of care: Skilled Nursing Facility Barriers to Discharge: No Barriers Identified   Patient Goals and CMS Choice Patient states their goals for this hospitalization and ongoing recovery are:: SNF          Discharge Placement              Patient chooses bed at:  [facility] SNF) Patient to be transferred to facility by: [facility] Name of family member notified: [name] Patient and family notified of of transfer: 12/26/23  Discharge Plan and Services Additional resources added to the After Visit Summary for   In-house Referral: Clinical Social Work                                   Social Drivers of Health (SDOH) Interventions SDOH Screenings   Food Insecurity: No Food Insecurity (12/25/2023)  Housing: Low Risk (12/25/2023)  Transportation Needs: No Transportation Needs (12/25/2023)  Utilities: Not At Risk (12/25/2023)  Financial Resource Strain: Low Risk (02/12/2023)   Received from [facility]  Social Connections: Moderately Isolated (12/25/2023)  Tobacco Use: High Risk (12/25/2023)     Readmission Risk Interventions     No data to display

## 2023-12-26 NOTE — Plan of Care (Signed)
  Problem: Education: Goal: Knowledge of General Education information will improve Description: Including pain rating scale, medication(s)/side effects and non-pharmacologic comfort measures Outcome: Progressing   Problem: Health Behavior/Discharge Planning: Goal: Ability to manage health-related needs will improve Outcome: Progressing   Problem: Clinical Measurements: Goal: Ability to maintain clinical measurements within normal limits will improve Outcome: Progressing Goal: Will remain free from infection Outcome: Progressing Goal: Diagnostic test results will improve Outcome: Progressing Goal: Respiratory complications will improve Outcome: Progressing Goal: Cardiovascular complication will be avoided Outcome: Progressing   Problem: Activity: Goal: Risk for activity intolerance will decrease Outcome: Progressing   Problem: Nutrition: Goal: Adequate nutrition will be maintained Outcome: Progressing   Problem: Coping: Goal: Level of anxiety will decrease Outcome: Progressing   Problem: Elimination: Goal: Will not experience complications related to bowel motility Outcome: Progressing Goal: Will not experience complications related to urinary retention Outcome: Progressing   Problem: Pain Managment: Goal: General experience of comfort will improve and/or be controlled Outcome: Progressing   Problem: Safety: Goal: Ability to remain free from injury will improve Outcome: Progressing   Problem: Skin Integrity: Goal: Risk for impaired skin integrity will decrease Outcome: Progressing   Problem: Education: Goal: Understanding of cardiac disease, CV risk reduction, and recovery process will improve Outcome: Progressing Goal: Individualized Educational Video(s) Outcome: Progressing   Problem: Activity: Goal: Ability to tolerate increased activity will improve Outcome: Progressing   Problem: Cardiac: Goal: Ability to achieve and maintain adequate cardiovascular  perfusion will improve Outcome: Progressing   Problem: Health Behavior/Discharge Planning: Goal: Ability to safely manage health-related needs after discharge will improve Outcome: Progressing   Problem: Education: Goal: Ability to describe self-care measures that may prevent or decrease complications (Diabetes Survival Skills Education) will improve Outcome: Progressing Goal: Individualized Educational Video(s) Outcome: Progressing   Problem: Coping: Goal: Ability to adjust to condition or change in health will improve Outcome: Progressing   Problem: Fluid Volume: Goal: Ability to maintain a balanced intake and output will improve Outcome: Progressing   Problem: Health Behavior/Discharge Planning: Goal: Ability to identify and utilize available resources and services will improve Outcome: Progressing Goal: Ability to manage health-related needs will improve Outcome: Progressing   Problem: Metabolic: Goal: Ability to maintain appropriate glucose levels will improve Outcome: Progressing   Problem: Nutritional: Goal: Maintenance of adequate nutrition will improve Outcome: Progressing Goal: Progress toward achieving an optimal weight will improve Outcome: Progressing   Problem: Skin Integrity: Goal: Risk for impaired skin integrity will decrease Outcome: Progressing   Problem: Tissue Perfusion: Goal: Adequacy of tissue perfusion will improve Outcome: Progressing

## 2023-12-26 NOTE — [place] FL2 (Addendum)
 [place]  MEDICAID FL2 LEVEL OF CARE FORM     IDENTIFICATION  Patient Name: Hannah [name] Birthdate: 06-12-1960 Sex: female Admission Date (Current Location): 12/24/2023  [facility] and [place] Number:  Producer, Television/film/video and Address:  [facility], [address] [place], [place] [address]      Provider Number: [id number]  Attending Physician Name and Address:  Kriste Emeline BRAVO, DO  Relative Name and Phone Number:  Shacoya Burkhammer (spouse) [phone]    Current Level of Care: Hospital Recommended Level of Care: Skilled Nursing Facility Prior Approval Number:    Date Approved/Denied:   PASRR Number: [id number]  Discharge Plan: SNF    Current Diagnoses: Patient Active Problem List   Diagnosis Date Noted   Carotid artery disease 12/26/2023   Hyperlipidemia 12/26/2023   NSTEMI (non-ST elevated myocardial infarction) (HCC) 12/24/2023   Labial abscess 08/21/2016   Cellulitis, perineum 08/21/2016   Abscess of left axilla 08/21/2016   Cellulitis of axilla, left 08/21/2016   Chronic diastolic CHF (congestive heart failure) (HCC) 08/21/2016   Supratherapeutic INR 08/21/2016   Iron deficiency anemia    Essential hypertension    DVT (deep venous thrombosis) (HCC)    Type 2 diabetes mellitus (HCC)    CKD (chronic kidney disease), stage III (HCC)     Orientation RESPIRATION BLADDER Height & Weight     Self, Time, Situation, Place  Normal Continent Weight: (P) 219 lb 12.8 oz (99.7 kg) Height:  (P) 5' 8 (172.7 cm)  BEHAVIORAL SYMPTOMS/MOOD NEUROLOGICAL BOWEL NUTRITION STATUS      Continent Diet (Please see discharge summary)  AMBULATORY STATUS COMMUNICATION OF NEEDS Skin   extensive Verbally Other (Comment) (Abrasion,Knee,L,Ecchymosis,Arm,Abdomen,Chest,L,Bil.,Upper,Wound/[place] LDAs,Wound knee,Anterior,R,Wound vascular,ulcer,ankle,anterior,L,Wound vascular ulcer,pretibial,L,lateral,wound vascular ulcer tibial,L,Posterior,wound  vas.,ulcer,foot,ant.,L,Lateral)                       Personal Care Assistance Level of Assistance  Bathing, Feeding, Dressing Bathing Assistance: Limited assistance Feeding assistance: Independent Dressing Assistance: Limited assistance     Functional Limitations Info  Sight, Hearing, Speech Sight Info: Adequate Hearing Info: Adequate Speech Info: Adequate    SPECIAL CARE FACTORS FREQUENCY  PT (By licensed PT), OT (By licensed OT)     PT Frequency: 5x min weekly OT Frequency: 5x min weekly            Contractures Contractures Info: Not present    Additional Factors Info  Code Status, Allergies, Psychotropic, Insulin  Sliding Scale Code Status Info: FULL Allergies Info: Aleve (naproxen),Maxipime (cefepime) Psychotropic Info: clonazePAM  (KLONOPIN ) tablet 0.5 mg daily at bedtime,gabapentin  (NEURONTIN ) capsule 300 mg 3 times daily Insulin  Sliding Scale Info: insulin  aspart (novoLOG ) injection 0-9 Units 3 times daily with meals,insulin  glargine (LANTUS ) injection 10 Units daily at bedtime,sertraline  (ZOLOFT ) tablet 25 mg daily       Current Medications (12/26/2023):  This is the current hospital active medication list Current Facility-Administered Medications  Medication Dose Route Frequency Provider Last Rate Last Admin   acetaminophen  (TYLENOL ) tablet 650 mg  650 mg Oral Q4H PRN Goodrich, Callie E, PA-C       amLODipine  (NORVASC ) tablet 5 mg  5 mg Oral Daily Goodrich, Callie E, PA-C   5 mg at 12/26/23 1035   aspirin  EC tablet 81 mg  81 mg Oral Daily Goodrich, Callie E, PA-C   81 mg at 12/26/23 1034   atorvastatin  (LIPITOR) tablet 80 mg  80 mg Oral Daily Goodrich, Callie E, PA-C   80 mg at 12/26/23 1035  bisoprolol  (ZEBETA ) tablet 2.5 mg  2.5 mg Oral Daily Goodrich, Callie E, PA-C   2.5 mg at 12/26/23 1035   Chlorhexidine Gluconate Cloth 2 % PADS 6 each  6 each Topical Daily Segal, Jared E, DO   6 each at 12/25/23 1614   clonazePAM  (KLONOPIN ) tablet 0.5 mg  0.5 mg  Oral QHS Goodrich, Callie E, PA-C   0.5 mg at 12/25/23 2200   clopidogrel  (PLAVIX ) tablet 75 mg  75 mg Oral Daily Goodrich, Callie E, PA-C   75 mg at 12/26/23 1035   collagenase  (SANTYL ) ointment   Topical Daily Debera Jayson MATSU, MD       cyclobenzaprine  (FLEXERIL ) tablet 10 mg  10 mg Oral BID PRN Goodrich, Callie E, PA-C       gabapentin  (NEURONTIN ) capsule 300 mg  300 mg Oral TID Goodrich, Callie E, PA-C   300 mg at 12/26/23 1035   insulin  aspart (novoLOG ) injection 0-9 Units  0-9 Units Subcutaneous TID WC Parcells, Taylor A, PA-C   4 Units at 12/25/23 1614   insulin  glargine (LANTUS ) injection 10 Units  10 Units Subcutaneous QHS Parcells, Taylor A, PA-C   10 Units at 12/25/23 2203   isosorbide mononitrate (IMDUR) 24 hr tablet 15 mg  15 mg Oral Daily Parcells, Taylor A, PA-C   15 mg at 12/26/23 1035   mupirocin  ointment (BACTROBAN ) 2 % 1 Application  1 Application Nasal BID Segal, Jared E, DO   1 Application at 12/26/23 1035   nitroGLYCERIN  (NITROSTAT ) SL tablet 0.4 mg  0.4 mg Sublingual Q5 Min x 3 PRN Goodrich, Callie E, PA-C       ondansetron  (ZOFRAN ) injection 4 mg  4 mg Intravenous Q6H PRN Goodrich, Callie E, PA-C       sertraline  (ZOLOFT ) tablet 25 mg  25 mg Oral Daily Goodrich, Callie E, PA-C   25 mg at 12/26/23 1034   traMADol  (ULTRAM ) tablet 50 mg  50 mg Oral Q12H PRN Goodrich, Callie E, PA-C         Discharge Medications: Please see discharge summary for a list of discharge medications.  Relevant Imaging Results:  Relevant Lab Results:   Additional Information [id number]  Isaiah Public, LCSWA

## 2023-12-26 NOTE — Progress Notes (Signed)
° °  Reached out to GI due to repeated concerns of dysphagia from patient. She most recently reported that she attempted to eat some grapes 12/10 PM and felt they were stuck in her throat and felt relief after vomiting. Spoke with GI attending and NP who recommended barium swallow study inpatient and then will provide further recs following results of study and determine if further inpatient workup is necessary or if can defer to outpatient. Discussed with Dr. Kriste, will order barium swallow and follow results of study.  Hannah DELENA Donath, PA-C 12/26/2023 10:06 AM

## 2023-12-26 NOTE — Progress Notes (Addendum)
 Progress Note  Patient Name: Hannah [name] Date of Encounter: 12/26/2023 Cardiologist: ALM CUNAS, MD   Interval Summary   Creatinine 1.22 ? 1.46  ? 1.03 SLP saw yesterday and noted no s/s of aspiration  Will ask PT/OT to work with patient to ambulate prior to discharge  Patient is eager to discharge   Vital Signs Vitals:   12/26/23 1100 12/26/23 1341 12/26/23 1343 12/26/23 1347  BP: (!) 100/47 (!) 154/65 128/73 129/60  Pulse: 60     Resp: 17     Temp: 98.9 F (37.2 C)     TempSrc: Oral     SpO2: 100%     Weight:      Height:        Intake/Output Summary (Last 24 hours) at 12/26/2023 1358 Last data filed at 12/26/2023 1024 Gross per 24 hour  Intake 451.69 ml  Output 1250 ml  Net -798.31 ml      12/24/2023    6:46 PM 09/02/2016    4:51 AM 08/31/2016    5:54 AM  Last 3 Weights  Weight (lbs) 219 lb 12.8 oz 241 lb 9.6 oz 246 lb 3.2 oz  Weight (kg) 99.7 kg 109.589 kg 111.676 kg     Telemetry/ECG  Sinus rhythm, HR 60s- Personally Reviewed  Physical Exam  GEN: No acute distress.   Neck: No JVD Cardiac: RRR, soft systolic murmur.  Respiratory: Clear to auscultation bilaterally. GI: Soft, nontender, non-distended  MS: Chronic venous insufficiency  Assessment & Plan   NSTEMI, unclear if Type I or Type II Multivessel CAD Hyperlipidemia  LHC ([place]) from 2022: severe multivessel disease, not a surgical candidate at that time Presented to [facility] with abdominal pain, nausea and emesis, as well as intermittent chest discomfort  High-sensitivity troponin I level peaked at 2.13, ECG showed nonspecific ST changes  Echo: LVEF 55 to 60% with mild to moderate LVH, no specific regional wall motion abnormalities, trivial mitral regurgitation, and mild aortic valve sclerosis without stenosis  She was transferred from [facility] hospital to [facility] for cardiac cath Upon arrival our team has decided that given no symptoms, known CAD that is not a good candidate for  surgical revascularization, severe PAD, CKD stage 4, type 2 diabetes, carotid artery disease s/p TCAR, recurrent  osteomyelitis with recent amputations and currently residing in a nursing facility with very limited mobility status she would not be a good candidate for repeat cardiac cath Treat with IV heparin  x 48 hours, was started on at [facility] so will have to confirm that she has been on for 48hrs and will discontinue Continue DAPT with ASA + Plavix  Continue Lipitor 80 mg daily  Adding Imdur 15 mg today   Hypertension Most recent BP 126/57 Continue amlodipine  5 mg daily Continue bisoprolol  2.5 mg daily Adding Imdur 15 mg today   Type 2 diabetes HgA1c 8.9% this admission  Continue SSI while inpatient   Severe PAD  Carotid artery disease s/p right TCAR  Continue statin Continue DAPT with ASA + Plavix    For questions or updates, please contact  [facility] Please consult [email or web address] for contact info under       Signed, Waddell Donath, PA   Patient seen and examined, note reviewed with the signed Advanced Practice Provider. I personally reviewed laboratory data, imaging studies and relevant notes. I independently examined the patient and formulated the important aspects of the plan. I have personally discussed the plan with the patient and/or family. Comments or changes to the  note/plan are indicated below.  HPI: Complained of repeat difficulty swallowing grapes last night and felt like the got caught in her throat with chest pain associated. Discussed with GI and patient underwent a barium study as below. She is eager to leave.   My Exam:  Physical Exam Vitals and nursing note reviewed.  Constitutional:      Appearance: Normal appearance.  HENT:     Head: Normocephalic and atraumatic.  Eyes:     Conjunctiva/sclera: Conjunctivae normal.  Cardiovascular:     Rate and Rhythm: Normal rate and regular rhythm.  Pulmonary:     Effort: Pulmonary effort is  normal.     Breath sounds: Normal breath sounds.  Musculoskeletal:        General: No swelling or tenderness.  Skin:    Coloration: Skin is not jaundiced or pale.  Neurological:     Mental Status: She is alert.      Telemetry: NSR - Personally reviewed  Assessment & Plan:   Chest pain, possibly GI - reportedly felt like her food got stuck in her chest. SLP evaluation was unrevealing. Had repeat symptoms last night leading to barium swallow which was negative for stricture but showed moderate to severe dysmotility and concern for esophagitis. Will start on daily PPI and have the patient follow up with GI outpatient.  NSTEMI with known history of multivessel CAD and poor candidacy for intervention, unclear if Type 1 or Type 2 - see APP note above. Not in heart failure. Started on Imdur 15 mg Hyperlipidemia Hypertension with an episode of hypotension - received all BP meds at once and stood up for the first time in days. Given 250 mL bolus IV fluids with improvement. Reduce amlodipine  to 2.5 mg daily now that she is on imdur 15 with the episode of hypotension Type 2 diabetes Severe PAD CKD IV Carotid artery disease s/p right TCAR   Time coordinating patient care: 75 minutes. Patient is medically stable for discharge today. Awaiting SW input.   SignedEmeline Calender, DO   [facility]  12/26/2023 1:58 PM

## 2023-12-26 NOTE — Evaluation (Signed)
 Physical Therapy Evaluation Patient Details Name: Hannah [name] MRN: [id number] DOB: 1961/01/01 Today's Date: 12/26/2023  History of Present Illness  Pt is 63 yo presenting to [facility] on 12/9 from [facility] due to NSTEMI after being sent to [facility] from SNF due to abdominal pain, nausea, emesis, diarrhea and intermittent chest discomfort. Pt found to not be a candidate for cardiac catheterization and is being treated with medical management. Pmh: Anxiety depression, CAD s/p TCAR, Diastolic HF, CKD, DVT, iron deficiency anemia, osteomyelitis, PAD, HTN, TIA, DM II  Clinical Impression  Pt is currently presenting at Min A for bed mobility, Mod A for sit to stand and CGA for very short distance non-functional gait. Pt very dizzy after short distance gait with BP 100/47 after pt back in sitting. MD note from October pt can wear off loading shoe or cam boot no stated WB restrictions. Due to pt current functional status, home set up and available assistance at home recommending skilled physical therapy services < 3 hours/day in order to address strength, balance and functional mobility to decrease risk for falls, injury, immobility, skin break down and re-hospitalization.          If plan is discharge home, recommend the following: Help with stairs or ramp for entrance;Assist for transportation;Assistance with cooking/housework   Can travel by private vehicle   No    Equipment Recommendations None recommended by PT     Functional Status Assessment Patient has had a recent decline in their functional status and demonstrates the ability to make significant improvements in function in a reasonable and predictable amount of time.     Precautions / Restrictions Precautions Precautions: Fall Recall of Precautions/Restrictions: Intact Restrictions Weight Bearing Restrictions Per Provider Order: No Other Position/Activity Restrictions: Per Dr. Lowry note pt in off loading or CAM boot. Heel WB if  able. No real WB precautions note from October 28th, 2025      Mobility  Bed Mobility Overal bed mobility: Needs Assistance Bed Mobility: Supine to Sit     Supine to sit: Min assist     General bed mobility comments: Min A to get trunk to mid line.    Transfers Overall transfer level: Needs assistance Equipment used: Rolling walker (2 wheels) Transfers: Sit to/from Stand Sit to Stand: Mod assist           General transfer comment: Mod A for initial momentum to get to standing. Pt requires verbal cues for safe hand placement.    Ambulation/Gait Ambulation/Gait assistance: Contact guard assist Gait Distance (Feet): 4 Feet Assistive device: Rolling walker (2 wheels) Gait Pattern/deviations: Step-to pattern, Decreased stance time - left, Decreased step length - left Gait velocity: decreased Gait velocity interpretation: <1.31 ft/sec, indicative of household ambulator   General Gait Details: Mod UE support on RW, verbal cues for sequencing and heel WB. Pt became dizzy and had to step back after 4 ft of gait. BP 100/47 after sitting ~ 1 min previous BP taken at 7:30 150/60.    Balance Overall balance assessment: Needs assistance Sitting-balance support: Bilateral upper extremity supported, Feet supported Sitting balance-Leahy Scale: Fair     Standing balance support: Bilateral upper extremity supported, During functional activity, Reliant on assistive device for balance Standing balance-Leahy Scale: Poor       Pertinent Vitals/Pain Pain Assessment Pain Assessment: 0-10 Pain Score: 2  Pain Location: both feet Pain Descriptors / Indicators: Burning Pain Intervention(s): Monitored during session    Home Living Family/patient expects to be discharged to::  Skilled nursing facility     Prior Function Prior Level of Function : Needs assist             Mobility Comments: Uses W/C mostly for mobility right now. Was ambulating with PT short distance at  rehab. ADLs Comments: Pt needs help donning her pants, gets bathed by staff at [facility]     Extremity/Trunk Assessment   Upper Extremity Assessment Upper Extremity Assessment: Generalized weakness    Lower Extremity Assessment Lower Extremity Assessment: Generalized weakness;LLE deficits/detail LLE Deficits / Details: Wound on L foot    Cervical / Trunk Assessment Cervical / Trunk Assessment: Normal  Communication   Communication Communication: No apparent difficulties    Cognition Arousal: Alert Behavior During Therapy: WFL for tasks assessed/performed   PT - Cognitive impairments: No apparent impairments     Following commands: Intact       Cueing Cueing Techniques: Verbal cues     General Comments General comments (skin integrity, edema, etc.): HR 74 bpm with activity. Decrease in BP with gait.        Assessment/Plan    PT Assessment Patient needs continued PT services  PT Problem List Decreased strength;Decreased activity tolerance;Decreased balance;Decreased mobility       PT Treatment Interventions DME instruction;Balance training;Gait training;Functional mobility training;Therapeutic activities;Therapeutic exercise;Patient/family education;Wheelchair mobility training    PT Goals (Current goals can be found in the Care Plan section)  Acute Rehab PT Goals Patient Stated Goal: improve mobility, get back to walking and go home PT Goal Formulation: With patient Time For Goal Achievement: 01/09/24 Potential to Achieve Goals: Fair    Frequency Min 1X/week        AM-PAC PT 6 Clicks Mobility  Outcome Measure Help needed turning from your back to your side while in a flat bed without using bedrails?: A Little Help needed moving from lying on your back to sitting on the side of a flat bed without using bedrails?: A Little Help needed moving to and from a bed to a chair (including a wheelchair)?: A Lot Help needed standing up from a chair using your arms  (e.g., wheelchair or bedside chair)?: A Lot Help needed to walk in hospital room?: Total Help needed climbing 3-5 steps with a railing? : Total 6 Click Score: 12    End of Session Equipment Utilized During Treatment: Gait belt Activity Tolerance: Patient tolerated treatment well;Other (comment) (limited by BP) Patient left: in bed;with call bell/phone within reach;with bed alarm set Nurse Communication: Mobility status PT Visit Diagnosis: Unsteadiness on feet (R26.81);Other abnormalities of gait and mobility (R26.89);Muscle weakness (generalized) (M62.81)    Time: 8968-8892 PT Time Calculation (min) (ACUTE ONLY): 36 min   Charges:   PT Evaluation $PT Eval Low Complexity: 1 Low PT Treatments $Therapeutic Activity: 8-22 mins PT General Charges $$ ACUTE PT VISIT: 1 Visit         Dorothyann Maier, DPT, CLT  Acute Rehabilitation Services Office: [phone] (Secure chat preferred)   Dorothyann VEAR Maier 12/26/2023, 11:17 AM

## 2024-01-01 ENCOUNTER — Other Ambulatory Visit: Payer: Self-pay

## 2024-01-01 ENCOUNTER — Emergency Department (HOSPITAL_COMMUNITY)
Admission: EM | Admit: 2024-01-01 | Discharge: 2024-01-01 | Disposition: A | Discharge status: Home / Self Care | Attending: Emergency Medicine | Admitting: Emergency Medicine

## 2024-01-01 ENCOUNTER — Emergency Department (HOSPITAL_COMMUNITY)

## 2024-01-01 ENCOUNTER — Encounter (HOSPITAL_COMMUNITY): Payer: Self-pay

## 2024-01-01 DIAGNOSIS — I2510 Atherosclerotic heart disease of native coronary artery without angina pectoris: Secondary | ICD-10-CM | POA: Diagnosis not present

## 2024-01-01 DIAGNOSIS — R079 Chest pain, unspecified: Secondary | ICD-10-CM | POA: Insufficient documentation

## 2024-01-01 DIAGNOSIS — F17210 Nicotine dependence, cigarettes, uncomplicated: Secondary | ICD-10-CM | POA: Insufficient documentation

## 2024-01-01 DIAGNOSIS — N183 Chronic kidney disease, stage 3 unspecified: Secondary | ICD-10-CM | POA: Diagnosis not present

## 2024-01-01 DIAGNOSIS — K920 Hematemesis: Secondary | ICD-10-CM | POA: Insufficient documentation

## 2024-01-01 DIAGNOSIS — I130 Hypertensive heart and chronic kidney disease with heart failure and stage 1 through stage 4 chronic kidney disease, or unspecified chronic kidney disease: Secondary | ICD-10-CM | POA: Diagnosis not present

## 2024-01-01 DIAGNOSIS — I509 Heart failure, unspecified: Secondary | ICD-10-CM | POA: Insufficient documentation

## 2024-01-01 DIAGNOSIS — E1122 Type 2 diabetes mellitus with diabetic chronic kidney disease: Secondary | ICD-10-CM | POA: Insufficient documentation

## 2024-01-01 LAB — CBC
HCT: 34.4 % — ABNORMAL LOW (ref 36.0–46.0)
Hemoglobin: 11.1 g/dL — ABNORMAL LOW (ref 12.0–15.0)
MCH: 29.9 pg (ref 26.0–34.0)
MCHC: 32.3 g/dL (ref 30.0–36.0)
MCV: 92.7 fL (ref 80.0–100.0)
Platelets: 319 K/uL (ref 150–400)
RBC: 3.71 MIL/uL — ABNORMAL LOW (ref 3.87–5.11)
RDW: 15.3 % (ref 11.5–15.5)
WBC: 10.4 K/uL (ref 4.0–10.5)
nRBC: 0 % (ref 0.0–0.2)

## 2024-01-01 LAB — TROPONIN T, HIGH SENSITIVITY
Troponin T High Sensitivity: 56 ng/L — ABNORMAL HIGH (ref 0–19)
Troponin T High Sensitivity: 60 ng/L — ABNORMAL HIGH (ref 0–19)

## 2024-01-01 LAB — COMPREHENSIVE METABOLIC PANEL WITH GFR
ALT: 22 U/L (ref 0–44)
AST: 23 U/L (ref 15–41)
Albumin: 3.4 g/dL — ABNORMAL LOW (ref 3.5–5.0)
Alkaline Phosphatase: 118 U/L (ref 38–126)
Anion gap: 11 (ref 5–15)
BUN: 33 mg/dL — ABNORMAL HIGH (ref 8–23)
CO2: 24 mmol/L (ref 22–32)
Calcium: 9.2 mg/dL (ref 8.9–10.3)
Chloride: 104 mmol/L (ref 98–111)
Creatinine, Ser: 1.55 mg/dL — ABNORMAL HIGH (ref 0.44–1.00)
GFR, Estimated: 37 mL/min — ABNORMAL LOW (ref >60)
Glucose, Bld: 244 mg/dL — ABNORMAL HIGH (ref 70–99)
Potassium: 4.3 mmol/L (ref 3.5–5.1)
Sodium: 140 mmol/L (ref 135–145)
Total Bilirubin: 0.3 mg/dL (ref 0.0–1.2)
Total Protein: 6.7 g/dL (ref 6.5–8.1)

## 2024-01-01 LAB — PROTIME-INR
INR: 0.9 (ref 0.8–1.2)
Prothrombin Time: 13.2 s (ref 11.4–15.2)

## 2024-01-01 LAB — ABO/RH: ABO/RH(D): A POS

## 2024-01-01 LAB — LIPASE, BLOOD: Lipase: 27 U/L (ref 11–51)

## 2024-01-01 MED ORDER — ONDANSETRON HCL 4 MG/2ML IJ SOLN
4.0000 mg | Freq: Once | INTRAMUSCULAR | Status: AC | PRN
  Administered 2024-01-01: 08:00:00 4 mg via INTRAVENOUS
  Filled 2024-01-01: qty 2

## 2024-01-01 NOTE — ED Notes (Signed)
 PT[facility] called 11:59, eta be a while

## 2024-01-01 NOTE — Discharge Instructions (Signed)
 Please follow-up with your family doctor.  Please return for recurrent episodes where you think you throwing up blood or worsening chest pain or if you feel you are in a pass out.

## 2024-01-01 NOTE — ED Triage Notes (Signed)
 Pt arrived from [facility] c/o hematemesis that began at 0420. Pt recent admission with dx of NSTEMI went back to Rehab on heparin  drip and plavix  po.

## 2024-01-01 NOTE — ED Provider Notes (Signed)
 [facility] DEPT [facility] Emergency Department Provider Note MRN:  [id number]  Arrival date & time: 01/01/2024     Chief Complaint   Hematemesis   History of Present Illness   Hannah [name] is a 63 y.o. year-old female with a history of CAD presenting to the ED with chief complaint of hematemesis.  Vomiting blood at care facility this evening.  In rehab for recent heart attack.  Also having some chest pressure.  Denies shortness of breath, no abdominal pain.  Review of Systems  A thorough review of systems was obtained and all systems are negative except as noted in the HPI and PMH.   Patient's Health History    Past Medical History:  Diagnosis Date   Anxiety and depression    CAD (coronary artery disease)    Multivessel diagnosed at cardiac catheterization in 2022 - felt to be poor candidate for CABG and managed medically   Carotid artery disease    Status post TCAR 2023   Chronic diastolic (congestive) heart failure (HCC)    CKD (chronic kidney disease), stage III (HCC)    DVT (deep venous thrombosis) (HCC)    Iron deficiency anemia    Osteomyelitis (HCC)    PAD (peripheral artery disease)    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 on the left April 2025 - [facility]   Primary hypertension    TIA (transient ischemic attack)    Type 2 diabetes mellitus (HCC)     Past Surgical History:  Procedure Laterality Date   ABDOMINAL HYSTERECTOMY     BACK SURGERY     CHOLECYSTECTOMY     TOE AMPUTATION     TRANSCAROTID ARTERY REVASCULARIZATION (TCAR)     VULVA /PERINEUM BIOPSY N/A 08/29/2016   Procedure: Irrigation and Debridement with Insertion of Interuterine Device;  Surgeon: [name], MD;  Location: [facility] OR;  Service: Gynecology;  Laterality: N/A;  Irrigation and Debridement with Insertion of Interuterine Device    Family History  Problem Relation Age of Onset   Hypertension Mother    Diabetes Mellitus II Father    Kidney disease Father      Social History   Socioeconomic History   Marital status: Married    Spouse name: Not on file   Number of children: Not on file   Years of education: Not on file   Highest education level: Not on file  Occupational History   Not on file  Tobacco Use   Smoking status: Every Day    Current packs/day: 1.50    Average packs/day: 1.5 packs/day for 15.0 years (22.5 ttl pk-yrs)    Types: Cigarettes   Smokeless tobacco: Never  Substance and Sexual Activity   Alcohol use: No   Drug use: No   Sexual activity: Not on file  Other Topics Concern   Not on file  Social History Narrative   Not on file   Social Drivers of Health   Tobacco Use: High Risk (01/01/2024)   Patient History    Smoking Tobacco Use: Every Day    Smokeless Tobacco Use: Never    Passive Exposure: Not on file  Financial Resource Strain: Low Risk (02/12/2023)   Received from [facility]   Overall Financial Resource Strain (CARDIA)    Difficulty of Paying Living Expenses: Not very hard  Food Insecurity: No Food Insecurity (12/25/2023)   Epic    Worried About Running Out of Food in the Last Year: Never true    Ran Out  of Food in the Last Year: Never true  Transportation Needs: No Transportation Needs (12/25/2023)   Epic    Lack of Transportation (Medical): No    Lack of Transportation (Non-Medical): No  Physical Activity: Not on file  Stress: Not on file  Social Connections: Moderately Isolated (12/25/2023)   Social Connection and Isolation Panel    Frequency of Communication with Friends and Family: Twice a week    Frequency of Social Gatherings with Friends and Family: Twice a week    Attends Religious Services: Never    Database Administrator or Organizations: No    Attends Banker Meetings: Never    Marital Status: Married  Catering Manager Violence: Not At Risk (12/25/2023)   Epic    Fear of Current or Ex-Partner: No    Emotionally Abused: No    Physically Abused: No    Sexually  Abused: No  Depression (PHQ2-9): Not on file  Alcohol Screen: Not on file  Housing: Low Risk (12/25/2023)   Epic    Unable to Pay for Housing in the Last Year: No    Number of Times Moved in the Last Year: 0    Homeless in the Last Year: No  Utilities: Not At Risk (12/25/2023)   Epic    Threatened with loss of utilities: No  Health Literacy: Not on file     Physical Exam   Vitals:   01/01/24 0544  BP: (!) 196/86  Pulse: 94  Resp: 20  Temp: 98 F (36.7 C)  SpO2: 100%    CONSTITUTIONAL: Well-appearing, NAD NEURO/PSYCH:  Alert and oriented x 3, no focal deficits EYES:  eyes equal and reactive ENT/NECK:  no LAD, no JVD CARDIO: Regular rate, well-perfused, normal S1 and S2 PULM:  CTAB no wheezing or rhonchi GI/GU:  non-distended, non-tender MSK/SPINE:  No gross deformities, no edema SKIN:  no rash, atraumatic   *Additional and/or pertinent findings included in MDM below  Diagnostic and Interventional Summary    EKG Interpretation Date/Time:  Wednesday January 01 2024 06:01:44 EST Ventricular Rate:  91 PR Interval:  145 QRS Duration:  111 QT Interval:  379 QTC Calculation: 467 R Axis:   6  Text Interpretation: Sinus rhythm Anterior infarct, old Confirmed by Theadore Sharper [phone]) on 01/01/2024 6:14:00 AM       Labs Reviewed  CBC - Abnormal; Notable for the following components:      Result Value   RBC 3.71 (*)    Hemoglobin 11.1 (*)    HCT 34.4 (*)    All other components within normal limits  COMPREHENSIVE METABOLIC PANEL WITH GFR - Abnormal; Notable for the following components:   Glucose, Bld 244 (*)    BUN 33 (*)    Creatinine, Ser 1.55 (*)    Albumin 3.4 (*)    GFR, Estimated 37 (*)    All other components within normal limits  TROPONIN T, HIGH SENSITIVITY - Abnormal; Notable for the following components:   Troponin T High Sensitivity 56 (*)    All other components within normal limits  LIPASE, BLOOD  PROTIME-INR  ABO/RH  TYPE AND SCREEN     DG Chest Port 1 View  Final Result      Medications  ondansetron  (ZOFRAN ) injection 4 mg (has no administration in time range)     Procedures  /  Critical Care Procedures  ED Course and Medical Decision Making  Initial Impression and Ddx Recently discharged from hospital after NSTEMI.  Found to  have diffuse CAD on recent cath, not a good CABG candidate, plan is for medical treatment.  Having chest pain currently in the setting of recent hematemesis.  Question ACS versus GI etiology.  Hypertensive on arrival, well-perfused, says she is feeling better currently  Past medical/surgical history that increases complexity of ED encounter: CAD  Interpretation of Diagnostics I personally reviewed the EKG and my interpretation is as follows: Sinus rhythm without concerning ischemic findings  Labs pending  Patient Reassessment and Ultimate Disposition/Management     Signed out to oncoming provider.  Patient management required discussion with the following services or consulting groups:  None  Complexity of Problems Addressed Acute illness or injury that poses threat of life of bodily function  Additional Data Reviewed and Analyzed Further history obtained from: Recent discharge summary and Prior labs/imaging results  Additional Factors Impacting ED Encounter Risk Consideration of hospitalization  Ozell HERO. Theadore, MD [facility] Emergency Medicine [facility] [email or web address]  Final Clinical Impressions(s) / ED Diagnoses     ICD-10-CM   1. Chest pain, unspecified type  R07.9       ED Discharge Orders     None        Discharge Instructions Discussed with and Provided to Patient:   Discharge Instructions   None      Theadore Ozell HERO, MD 01/01/24 [phone]

## 2024-01-01 NOTE — ED Provider Notes (Signed)
 Received patient in turnover from Dr. Theadore.  Please see their note for further details of Hx, PE.  Briefly patient is a 63 y.o. female with a Hematemesis .  Single episode, having chest pain.  Recent NSTEMI.  Plan for second trop and reassessment.  Second troponin essentially unchanged from first.  Patient reassessed and feels okay.  She would like to try and go home.  Will have her follow-up with her doctor in clinic.  Encouraged her to return anytime her symptoms worsen if she starts coughing up more blood or if she feels like she is going to pass out.    Emil Share, DO 01/01/24 1048

## 2024-01-14 ENCOUNTER — Ambulatory Visit: Admitting: Cardiology

## 2024-01-14 DIAGNOSIS — G459 Transient cerebral ischemic attack, unspecified: Secondary | ICD-10-CM | POA: Insufficient documentation

## 2024-01-14 DIAGNOSIS — I2510 Atherosclerotic heart disease of native coronary artery without angina pectoris: Secondary | ICD-10-CM | POA: Insufficient documentation

## 2024-01-14 DIAGNOSIS — M869 Osteomyelitis, unspecified: Secondary | ICD-10-CM | POA: Insufficient documentation

## 2024-01-14 DIAGNOSIS — I739 Peripheral vascular disease, unspecified: Secondary | ICD-10-CM | POA: Insufficient documentation

## 2024-01-14 DIAGNOSIS — F419 Anxiety disorder, unspecified: Secondary | ICD-10-CM | POA: Insufficient documentation

## 2024-01-15 ENCOUNTER — Ambulatory Visit: Attending: Cardiology | Admitting: Cardiology

## 2024-01-15 ENCOUNTER — Encounter: Payer: Self-pay | Admitting: Cardiology

## 2024-01-15 VITALS — BP 110/50 | HR 76 | Ht 68.0 in | Wt 224.4 lb

## 2024-01-15 DIAGNOSIS — I6523 Occlusion and stenosis of bilateral carotid arteries: Secondary | ICD-10-CM

## 2024-01-15 DIAGNOSIS — I5032 Chronic diastolic (congestive) heart failure: Secondary | ICD-10-CM | POA: Diagnosis not present

## 2024-01-15 DIAGNOSIS — I10 Essential (primary) hypertension: Secondary | ICD-10-CM | POA: Diagnosis not present

## 2024-01-15 DIAGNOSIS — I25118 Atherosclerotic heart disease of native coronary artery with other forms of angina pectoris: Secondary | ICD-10-CM | POA: Diagnosis not present

## 2024-01-15 DIAGNOSIS — E782 Mixed hyperlipidemia: Secondary | ICD-10-CM | POA: Diagnosis not present

## 2024-01-15 NOTE — Patient Instructions (Signed)
Medication Instructions:  Your physician recommends that you continue on your current medications as directed. Please refer to the Current Medication list given to you today.  *If you need a refill on your cardiac medications before your next appointment, please call your pharmacy*   Lab Work: None Ordered If you have labs (blood work) drawn today and your tests are completely normal, you will receive your results only by: MyChart Message (if you have MyChart) OR A paper copy in the mail If you have any lab test that is abnormal or we need to change your treatment, we will call you to review the results.   Testing/Procedures: Your physician has requested that you have a carotid duplex. This test is an ultrasound of the carotid arteries in your neck. It looks at blood flow through these arteries that supply the brain with blood. Allow one hour for this exam. There are no restrictions or special instructions.    Follow-Up: At [facility], you and your health needs are our priority.  As part of our continuing mission to provide you with exceptional heart care, we have created designated Provider Care Teams.  These Care Teams include your primary Cardiologist (physician) and Advanced Practice Providers (APPs -  Physician Assistants and Nurse Practitioners) who all work together to provide you with the care you need, when you need it.  We recommend signing up for the patient portal called "MyChart".  Sign up information is provided on this After Visit Summary.  MyChart is used to connect with patients for Virtual Visits (Telemedicine).  Patients are able to view lab/test results, encounter notes, upcoming appointments, etc.  Non-urgent messages can be sent to your provider as well.   To learn more about what you can do with MyChart, go to [email or web address].    Your next appointment:   3 month(s)  The format for your next appointment:   In Person  Provider:   Gypsy Balsam, MD     Other Instructions NA

## 2024-01-15 NOTE — Progress Notes (Unsigned)
 " Cardiology Office Note:    Date:  01/15/2024   ID:  Hannah Day Elzena, [name] [birth year], MRN [id number]  PCP:  [name], MD  Cardiologist:  Lamar Fitch, MD    Referring MD: [name], MD   No chief complaint on file. I am doing fine  History of Present Illness:    Hannah [name] is a 63 y.o. female with a complex past medical history.  She does have advanced coronary artery disease.  She did have cardiac catheterization done in 2022 in [place] which showed 75% stenosis of proximal RCA, 90% stenosed PDA, proximal LAD 75% stenosis, mid LAD 90%, diagonal 190%, circumflex proximal 90% obtuse marginal 190%.  She was felt to be poor candidate for surgery.  Since that time she is being managed medically.  She also got carotic endarterectomy done on the right side in 2023.  Diabetes, chronic kidney failure, history of osteomyelitis, history of hypertension hyperlipidemia she recently ended up going to [facility] because of chest pain.  Troponin I was elevated she was transferred to [facility] for potential cardiac catheterization but after that cardiac catheterization from 2022 has been reviewed and feeling was that she is not a candidate for intervention.  Also feeling was that her symptoms were related probably to some GI problem.  Managed conservatively.  Comes today to months for follow-up doing well denies have any chest pain tightness squeezing pressure mid chest.  Sadly she still continues to smoke.  She is sitting on the wheelchair all the time.  Past Medical History:  Diagnosis Date   Abscess of left axilla 08/21/2016   Abscess of left foot 01/27/2023   Acute cystitis with hematuria 11/07/2020   Acute kidney injury 11/07/2020   Anxiety and depression    CAD (coronary artery disease)    Multivessel diagnosed at cardiac catheterization in 2022 - felt to be poor candidate for CABG and managed medically   Carotid artery disease    Status post TCAR 2023    Carotid artery stenosis, symptomatic, left 02/27/2021   Cellulitis of axilla, left 08/21/2016   Cellulitis of left foot 11/07/2020   Cellulitis, perineum 08/21/2016   Chest pain syndrome 12/11/2020   Chronic diastolic (congestive) heart failure (HCC)    Cigarette nicotine  dependence without complication 09/22/2020   Cigarette smoker 05/07/2023   CKD (chronic kidney disease), stage III (HCC)    Class 3 severe obesity due to excess calories with serious comorbidity and body mass index (BMI) of 40.0 to 44.9 in adult (HCC) 12/11/2020   Most likely due to excessive calories.  Encourage heart healthy diet along with diabetic control.     Diabetic polyneuropathy associated with type 2 diabetes mellitus (HCC) 09/22/2020   DM type 2 with diabetic mixed hyperlipidemia (HCC) 09/22/2020   DVT (deep venous thrombosis) (HCC)    Eyelid lesion, benign 06/27/2015   Foot infection 01/27/2023   Gastroesophageal reflux disease without esophagitis 01/02/2023   History of DVT of lower extremity 10/31/2015   Hyperlipidemia 12/26/2023   Hypertensive retinopathy of both eyes 06/27/2015   Iron deficiency anemia    Labial abscess 08/21/2016   Lactic acidosis 11/07/2020   Local infection of skin and subcutaneous tissue 01/02/2023   Long term current use of anticoagulant 11/07/2020   Muscle atrophy 01/07/2023   Muscle weakness 01/02/2023   Myopia of both eyes 06/27/2015   NSTEMI (non-ST elevated myocardial infarction) (HCC) 12/24/2023   Nuclear sclerotic cataract of both eyes 06/27/2015   Obstructive sleep  apnea 11/07/2020   Uses CPAP     Osteomyelitis (HCC)    PAD (peripheral artery disease)    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 on the left April 2025 - [facility]   Postmenopausal bleeding 10/31/2015   Primary hypertension    Regular astigmatism of both eyes 06/27/2015   Supratherapeutic INR 08/21/2016   TIA (transient ischemic attack)    Type 2 diabetes mellitus (HCC)    Uncontrolled  type 2 diabetes mellitus with hyperglycemia, with long-term current use of insulin  (HCC) 11/07/2020   Yeast cystitis 11/07/2020   Yeast dermatitis 04/12/2023    Past Surgical History:  Procedure Laterality Date   ABDOMINAL HYSTERECTOMY     BACK SURGERY     CHOLECYSTECTOMY     TOE AMPUTATION     TRANSCAROTID ARTERY REVASCULARIZATION (TCAR)     VULVA MARYBETH BIOPSY N/A 08/29/2016   Procedure: Irrigation and Debridement with Insertion of Interuterine Device;  Surgeon: [name], MD;  Location: [facility] OR;  Service: Gynecology;  Laterality: N/A;  Irrigation and Debridement with Insertion of Interuterine Device    Current Medications: Active Medications[1]   Allergies:   Aleve [naproxen] and Maxipime [cefepime]   Social History   Socioeconomic History   Marital status: Married    Spouse name: Not on file   Number of children: Not on file   Years of education: Not on file   Highest education level: Not on file  Occupational History   Not on file  Tobacco Use   Smoking status: Every Day    Current packs/day: 1.50    Average packs/day: 1.5 packs/day for 15.0 years (22.5 ttl pk-yrs)    Types: Cigarettes   Smokeless tobacco: Never  Substance and Sexual Activity   Alcohol use: No   Drug use: No   Sexual activity: Not on file  Other Topics Concern   Not on file  Social History Narrative   Not on file   Social Drivers of Health   Tobacco Use: High Risk (01/15/2024)   Patient History    Smoking Tobacco Use: Every Day    Smokeless Tobacco Use: Never    Passive Exposure: Not on file  Financial Resource Strain: Low Risk (02/12/2023)   Received from [facility]   Overall Financial Resource Strain (CARDIA)    Difficulty of Paying Living Expenses: Not very hard  Food Insecurity: No Food Insecurity (12/25/2023)   Epic    Worried About Radiation Protection Practitioner of Food in the Last Year: Never true    Ran Out of Food in the Last Year: Never true  Transportation Needs: No Transportation  Needs (12/25/2023)   Epic    Lack of Transportation (Medical): No    Lack of Transportation (Non-Medical): No  Physical Activity: Not on file  Stress: Not on file  Social Connections: Moderately Isolated (12/25/2023)   Social Connection and Isolation Panel    Frequency of Communication with Friends and Family: Twice a week    Frequency of Social Gatherings with Friends and Family: Twice a week    Attends Religious Services: Never    Database Administrator or Organizations: No    Attends Banker Meetings: Never    Marital Status: Married  Depression (PHQ2-9): Not on file  Alcohol Screen: Not on file  Housing: Low Risk (12/25/2023)   Epic    Unable to Pay for Housing in the Last Year: No    Number of Times Moved in the Last Year: 0  Homeless in the Last Year: No  Utilities: Not At Risk (12/25/2023)   Epic    Threatened with loss of utilities: No  Health Literacy: Not on file     Family History: The patient's family history includes Diabetes Mellitus II in her father; Hypertension in her mother; Kidney disease in her father. ROS:   Please see the history of present illness.    All 14 point review of systems negative except as described per history of present illness  EKGs/Labs/Other Studies Reviewed:         Recent Labs: 01/01/2024: ALT 22; BUN 33; Creatinine, Ser 1.55; Hemoglobin 11.1; Platelets 319; Potassium 4.3; Sodium 140  Recent Lipid Panel No results found for: CHOL, TRIG, HDL, CHOLHDL, VLDL, LDLCALC, LDLDIRECT  Physical Exam:    VS:  BP (!) 110/50   Pulse 76   Ht 5' 8 (1.727 m)   Wt 224 lb 6.4 oz (101.8 kg)   SpO2 99%   BMI 34.12 kg/m     Wt Readings from Last 3 Encounters:  01/15/24 224 lb 6.4 oz (101.8 kg)  01/01/24 210 lb (95.3 kg)  12/24/23 (P) 219 lb 12.8 oz (99.7 kg)     GEN:  Well nourished, well developed in no acute distress HEENT: Normal NECK: No JVD; No carotid bruits LYMPHATICS: No lymphadenopathy CARDIAC:  RRR, no murmurs, no rubs, no gallops RESPIRATORY:  Clear to auscultation without rales, wheezing or rhonchi  ABDOMEN: Soft, non-tender, non-distended MUSCULOSKELETAL: Mild edema, dressing noted on the left foot. SKIN: Warm and dry LOWER EXTREMITIES: no swelling NEUROLOGIC:  Alert and oriented x 3 PSYCHIATRIC:  Normal affect   ASSESSMENT:    1. Primary hypertension   2. Coronary artery disease of native artery of native heart with stable angina pectoris   3. Chronic diastolic (congestive) heart failure (HCC)   4. Bilateral carotid artery stenosis   5. Mixed hyperlipidemia    PLAN:    In order of problems listed above:  Coronary disease advanced, not candidate for any intervention.  Continue conservative approach which include dual antiplatelet therapy. Peripheral vascular disease status post carotic stenting done in 2023, will make arrangements for carotic ultrasounds to recheck it. Chronic diastolic congestive heart failure.  Compensated.  Continue present management. Mixed dyslipidemia she is on high intensity statin which I will continue   Medication Adjustments/Labs and Tests Ordered: Current medicines are reviewed at length with the patient today.  Concerns regarding medicines are outlined above.  Orders Placed This Encounter  Procedures   EKG 12-Lead   Medication changes: No orders of the defined types were placed in this encounter.   Signed, Lamar DOROTHA Fitch, MD, [facility] 01/15/2024 9:45 AM    [facility]     [1]  Current Meds  Medication Sig   acetaminophen  (TYLENOL ) 500 MG tablet Take 1,000 mg by mouth 3 (three) times daily.   amLODipine  (NORVASC ) 5 MG tablet Take 0.5 tablets (2.5 mg total) by mouth daily.   aspirin  EC 81 MG tablet Take 1 tablet (81 mg total) by mouth daily. Swallow whole.   atorvastatin  (LIPITOR) 80 MG tablet Take 80 mg by mouth at bedtime.   Bisoprolol  Fumarate 2.5 MG TABS Take 2.5 mg by mouth daily.   clonazePAM   (KLONOPIN ) 0.5 MG tablet Take 0.5 mg by mouth at bedtime.   clopidogrel  (PLAVIX ) 75 MG tablet Take 75 mg by mouth daily.   cyclobenzaprine  (FLEXERIL ) 10 MG tablet Take 10 mg by mouth 3 (three) times daily.  Dextrose , Diabetic Use, (INSTA-GLUCOSE) 77.4 % GEL Take 1 Dose by mouth as needed (BS < 70). *if patient is arousable and able to swallow. Hold all diabetic medications until provider authorizes resumption. Remain with patient, keep patient in bed or chair for safety - repeat BG in 15 minutes.   famotidine  (PEPCID ) 20 MG tablet Take 20 mg by mouth daily.   ferrous sulfate  324 MG TBEC Take 324 mg by mouth daily at 12 noon.   furosemide (LASIX) 40 MG tablet Take 40 mg by mouth 2 (two) times daily.   gabapentin  (NEURONTIN ) 300 MG capsule Take 300 mg by mouth 3 (three) times daily.   glucagon Emergency 1 MG SOLR Inject 1 mg into the muscle as needed (BG < 70). *if patient is not arousable, conscious or unable to swallow. Hold all diabetic medications until provider authorizes resumption. Remain with patient, keep in bed or chair for safety - repeat BG in 15 minutes.   Heparin , Porcine, in NaCl (HEPARIN  SODIUM FLUSH IV) Inject 250 mLs into the vein See admin instructions. Heparin  Sodium/Sodium Chloride ; administer 250mL via IV @10mL /hr every 24 hours.   HYDROcodone-acetaminophen  (NORCO/VICODIN) 5-325 MG tablet Take 1 tablet by mouth every 6 (six) hours as needed (pain).   insulin  glargine (LANTUS  SOLOSTAR) 100 UNIT/ML Solostar Pen Inject 24 Units into the skin at bedtime.   insulin  lispro (HUMALOG) 100 UNIT/ML injection Inject 0-12 Units into the skin See admin instructions. Administer 0-12 units into the skin three times daily before meals per sliding scale: 000 - 200 : 0 unit 201 - 250 : 4 units 251 - 300 : 6 units 301 - 350 : 8 units 351 - 400 : 10 units 401 - 450 : 12 units, call MD.   Insulin  Regular Human (HUMULIN R ) 100 UNIT/ML KwikPen Inject as directed.   isosorbide  mononitrate (IMDUR ) 30  MG 24 hr tablet Take 0.5 tablets (15 mg total) by mouth daily.   meclizine (ANTIVERT) 25 MG tablet Take 25 mg by mouth every 8 (eight) hours as needed (vertigo).   nitroGLYCERIN  (NITROSTAT ) 0.4 MG SL tablet Place 1 tablet (0.4 mg total) under the tongue every 5 (five) minutes x 3 doses as needed for chest pain.   pantoprazole  (PROTONIX ) 40 MG tablet Take 40 mg by mouth daily.   pregabalin  (LYRICA ) 100 MG capsule Take 100 mg by mouth 2 (two) times daily.   promethazine (PHENERGAN) 25 MG/ML injection Inject 25 mg into the muscle once as needed for nausea or vomiting.   saxagliptin HCl (ONGLYZA) 2.5 MG TABS tablet Take 5 mg by mouth daily.   senna (SENOKOT) 8.6 MG TABS tablet Take 17.2 mg by mouth every 12 (twelve) hours as needed (constipation).   sertraline  (ZOLOFT ) 25 MG tablet Take 25 mg by mouth daily.   sodium chloride  0.9 % infusion Inject 1,000 mLs into the vein See admin instructions. Administer 1000mL via IV @100mL /hr every 24 hours.   traMADol  (ULTRAM ) 50 MG tablet Take 1 tablet (50 mg total) by mouth every 12 (twelve) hours as needed.   "

## 2024-02-12 ENCOUNTER — Ambulatory Visit

## 2024-03-12 ENCOUNTER — Ambulatory Visit

## 2024-04-08 ENCOUNTER — Ambulatory Visit: Admitting: Cardiology
